# Patient Record
Sex: Female | Born: 1945 | Race: Black or African American | Hispanic: No | Smoking: Former smoker
Health system: Southern US, Community
[De-identification: ages and names within clinical notes are randomized; demographics above are authoritative.]

## PROBLEM LIST (undated history)

## (undated) DIAGNOSIS — I10 Essential (primary) hypertension: Secondary | ICD-10-CM

## (undated) DIAGNOSIS — M549 Dorsalgia, unspecified: Secondary | ICD-10-CM

## (undated) DIAGNOSIS — E7800 Pure hypercholesterolemia, unspecified: Secondary | ICD-10-CM

## (undated) HISTORY — PX: ABDOMINAL HYSTERECTOMY: SHX81

## (undated) HISTORY — DX: Essential (primary) hypertension: I10

## (undated) HISTORY — DX: Pure hypercholesterolemia, unspecified: E78.00

## (undated) HISTORY — DX: Dorsalgia, unspecified: M54.9

---

## 2016-02-14 DIAGNOSIS — I10 Essential (primary) hypertension: Secondary | ICD-10-CM | POA: Insufficient documentation

## 2020-09-01 DIAGNOSIS — Z298 Encounter for other specified prophylactic measures: Secondary | ICD-10-CM | POA: Diagnosis not present

## 2020-09-01 DIAGNOSIS — I10 Essential (primary) hypertension: Secondary | ICD-10-CM | POA: Diagnosis not present

## 2021-01-18 DIAGNOSIS — R202 Paresthesia of skin: Secondary | ICD-10-CM | POA: Diagnosis not present

## 2021-01-18 DIAGNOSIS — M5416 Radiculopathy, lumbar region: Secondary | ICD-10-CM | POA: Diagnosis not present

## 2021-03-05 DIAGNOSIS — I10 Essential (primary) hypertension: Secondary | ICD-10-CM | POA: Diagnosis not present

## 2021-03-05 DIAGNOSIS — R0609 Other forms of dyspnea: Secondary | ICD-10-CM | POA: Diagnosis not present

## 2021-03-05 DIAGNOSIS — K5900 Constipation, unspecified: Secondary | ICD-10-CM | POA: Diagnosis not present

## 2021-03-05 DIAGNOSIS — M5432 Sciatica, left side: Secondary | ICD-10-CM | POA: Diagnosis not present

## 2021-03-05 DIAGNOSIS — Z0000 Encounter for general adult medical examination without abnormal findings: Secondary | ICD-10-CM | POA: Diagnosis not present

## 2021-03-09 ENCOUNTER — Other Ambulatory Visit: Payer: Self-pay | Admitting: Family Medicine

## 2021-03-09 ENCOUNTER — Ambulatory Visit
Admission: RE | Admit: 2021-03-09 | Discharge: 2021-03-09 | Disposition: A | Discharge status: Home / Self Care | Payer: Medicare Other | Source: Ambulatory Visit | Attending: Family Medicine | Admitting: Family Medicine

## 2021-03-09 DIAGNOSIS — R0609 Other forms of dyspnea: Secondary | ICD-10-CM

## 2021-03-10 DIAGNOSIS — Z1211 Encounter for screening for malignant neoplasm of colon: Secondary | ICD-10-CM | POA: Diagnosis not present

## 2021-03-22 DIAGNOSIS — M5442 Lumbago with sciatica, left side: Secondary | ICD-10-CM | POA: Diagnosis not present

## 2021-03-23 ENCOUNTER — Other Ambulatory Visit: Payer: Self-pay

## 2021-03-23 ENCOUNTER — Encounter: Payer: Self-pay | Admitting: Cardiology

## 2021-03-23 ENCOUNTER — Ambulatory Visit
Admission: RE | Admit: 2021-03-23 | Discharge: 2021-03-23 | Disposition: A | Discharge status: Home / Self Care | Payer: Medicare Other | Source: Ambulatory Visit | Attending: Sports Medicine | Admitting: Sports Medicine

## 2021-03-23 ENCOUNTER — Ambulatory Visit: Payer: Medicare Other | Admitting: Cardiology

## 2021-03-23 ENCOUNTER — Other Ambulatory Visit: Payer: Self-pay | Admitting: Sports Medicine

## 2021-03-23 VITALS — BP 172/89 | HR 80 | Temp 98.0°F | Resp 16 | Ht 66.0 in | Wt 232.0 lb

## 2021-03-23 DIAGNOSIS — I10 Essential (primary) hypertension: Secondary | ICD-10-CM

## 2021-03-23 DIAGNOSIS — M545 Low back pain, unspecified: Secondary | ICD-10-CM | POA: Diagnosis not present

## 2021-03-23 DIAGNOSIS — M5489 Other dorsalgia: Secondary | ICD-10-CM

## 2021-03-23 DIAGNOSIS — R6889 Other general symptoms and signs: Secondary | ICD-10-CM

## 2021-03-23 DIAGNOSIS — R0609 Other forms of dyspnea: Secondary | ICD-10-CM | POA: Diagnosis not present

## 2021-03-23 DIAGNOSIS — Z6837 Body mass index (BMI) 37.0-37.9, adult: Secondary | ICD-10-CM

## 2021-03-23 NOTE — Progress Notes (Signed)
Date:  03/23/2021   ID:  Allison Gentry, Allison Gentry Dec 06, 1945, MRN [id number]  PCP:  Aliene Beams, MD  Cardiologist:  Tessa Lerner, DO, [facility] (established care 03/23/21)  REASON FOR CONSULT: Dyspnea on exertion  REQUESTING PHYSICIAN:  Aliene Beams, MD [address] [place],  [place] [address]  Chief Complaint  Patient presents with   Shortness of Breath   New Patient (Initial Visit)    HPI  Allison Gentry is a 75 y.o. African-American female who presents to the office with a chief complaint of  "shortness of breath." Patient's past medical history and cardiovascular risk factors include: Hypertension, obesity due to excess calories, postmenopausal female, advanced age.  She is referred to the office at the request of Aliene Beams, MD for evaluation of shortness of breath.  Patient is referred to the office for evaluation of shortness of breath.  The shortness of breath is mostly brought on by effort related activities.  Patient states when she goes up a flight of stairs or walks longer than her normal distance the symptoms do surface.  They usually resolve with resting.  She denies orthopnea, paroxysmal nocturnal dyspnea or lower extremity swelling.  The symptoms have been ongoing for the last several years and has not decided to have them addressed after discussing them with her PCP during her wellness visit.  Patient states that she used to walk about a mile every other day but now avoids such activities due to dyspnea on exertion.  Patient states that her home blood pressures are relatively well controlled.  SBP is around 130 mmHg.  FUNCTIONAL STATUS: Used to walk 1 mile every other day.  ALLERGIES: No Known Allergies  MEDICATION LIST PRIOR TO VISIT: Current Meds  Medication Sig   amLODipine (NORVASC) 5 MG tablet Take 5 mg by mouth daily.   Ascorbic Acid (VITAMIN C) 1000 MG tablet Take 1,000 mg by mouth daily.   BLACK COHOSH EXTRACT PO Take by mouth.   BLACK ELDERBERRY PO  Take 1 tablet by mouth daily at 6 (six) AM.   calcium carbonate (OS-CAL - DOSED IN MG OF ELEMENTAL CALCIUM) 1250 (500 Ca) MG tablet Take 1 tablet by mouth.   magnesium oxide (MAG-OX) 400 MG tablet Take 400 mg by mouth daily.   Multiple Vitamin (MULTIVITAMIN) tablet Take 1 tablet by mouth daily.   Omega-3 Fatty Acids (FISH OIL OMEGA-3 PO) Take 2,000 mg by mouth daily.   pyridOXINE (VITAMIN B-6) 100 MG tablet Take 100 mg by mouth daily.   Vitamin A 2400 MCG (8000 UT) CAPS Take 1 tablet by mouth daily at 6 (six) AM.   VITAMIN B1-B12 PO Take 2,500 mg by mouth daily at 6 (six) AM.   vitamin E 180 MG (400 UNITS) capsule Take 400 Units by mouth daily.   Zinc 50 MG CAPS Take 1 capsule by mouth daily at 6 (six) AM.     PAST MEDICAL HISTORY: Past Medical History:  Diagnosis Date   Hypertension     PAST SURGICAL HISTORY: Past Surgical History:  Procedure Laterality Date   ABDOMINAL HYSTERECTOMY      FAMILY HISTORY: The patient family history includes Heart disease in her father.  SOCIAL HISTORY:  The patient  reports that she has quit smoking. Her smoking use included cigarettes. She has a 1.00 pack-year smoking history. She has never used smokeless tobacco. She reports that she does not currently use alcohol. She reports that she does not currently use drugs.  REVIEW OF SYSTEMS:  Review of Systems  Constitutional: Negative for chills and fever.  HENT:  Negative for hoarse voice and nosebleeds.   Eyes:  Negative for discharge, double vision and pain.  Cardiovascular:  Positive for dyspnea on exertion. Negative for chest pain, claudication, leg swelling, near-syncope, orthopnea, palpitations, paroxysmal nocturnal dyspnea and syncope.  Respiratory:  Negative for hemoptysis and shortness of breath.   Musculoskeletal:  Negative for muscle cramps and myalgias.  Gastrointestinal:  Negative for abdominal pain, constipation, diarrhea, hematemesis, hematochezia, melena, nausea and vomiting.   Neurological:  Negative for dizziness and light-headedness.   PHYSICAL EXAM: Vitals with BMI 03/23/2021  Height 5\' 6"   Weight 232 lbs  BMI 37.46  Systolic 172  Diastolic 89  Pulse 80    CONSTITUTIONAL: Well-developed and well-nourished. No acute distress.  SKIN: Skin is warm and dry. No rash noted. No cyanosis. No pallor. No jaundice HEAD: Normocephalic and atraumatic.  EYES: No scleral icterus.  Arcus senilis MOUTH/THROAT: Moist oral membranes.  NECK: No JVD present. No thyromegaly noted. No carotid bruits  LYMPHATIC: No visible cervical adenopathy.  CHEST Normal respiratory effort. No intercostal retractions  LUNGS: Clear to auscultation bilaterally. Clear to auscultation bilaterally.  No stridor. No wheezes. No rales.  CARDIOVASCULAR: Regular rate and rhythm, positive S1-S2, no murmurs rubs or gallops appreciated. ABDOMINAL: Obese, soft, nontender, nondistended, positive bowel sounds all 4 quadrants. No apparent ascites.  EXTREMITIES: No peripheral edema, warm to touch HEMATOLOGIC: No significant bruising NEUROLOGIC: Oriented to person, place, and time. Nonfocal. Normal muscle tone.  PSYCHIATRIC: Normal mood and affect. Normal behavior. Cooperative  CARDIAC DATABASE: EKG: 03/23/2021: Normal sinus rhythm, 76 bpm, normal axis, without underlying ischemia or injury pattern.  Echocardiogram: No results found for this or any previous visit from the past 1095 days.    Stress Testing: No results found for this or any previous visit from the past 1095 days.   Heart Catheterization: None  LABORATORY DATA: No flowsheet data found.  No flowsheet data found.  Lipid Panel  No results found for: CHOL, TRIG, HDL, CHOLHDL, VLDL, LDLCALC, LDLDIRECT, LABVLDL  No components found for: NTPROBNP No results for input(s): PROBNP in the last 8760 hours. No results for input(s): TSH in the last 8760 hours.  BMP No results for input(s): NA, K, CL, CO2, GLUCOSE, BUN, CREATININE,  CALCIUM, GFRNONAA, GFRAA in the last 8760 hours.  HEMOGLOBIN A1C No results found for: HGBA1C, MPG  External Labs: Collected: 03/03/2020. Total cholesterol 203, triglycerides 149, HDL 62, LDL 117, non-HDL 142. Sodium 139, potassium 4.3, BUN 12, chloride 102, bicarb 29, creatinine 0.99  IMPRESSION:    ICD-10-CM   1. Dyspnea on exertion  R06.09 EKG 12-Lead    PCV ECHOCARDIOGRAM COMPLETE    PCV CARDIAC STRESS TEST    CT CARDIAC SCORING ([facility] LOCATIONS ONLY)    2. Worsening functional endurance  R68.89 CT CARDIAC SCORING ([facility] LOCATIONS ONLY)    3. Benign hypertension  I10     4. Class 2 severe obesity due to excess calories with serious comorbidity and body mass index (BMI) of 37.0 to 37.9 in adult [facility])  E66.01    Z68.37        RECOMMENDATIONS: NABILA ALBARRACIN is a 75 y.o. African-American female whose past medical history and cardiac risk factors include: Hypertension, obesity due to excess calories, postmenopausal female, advanced age.  Dyspnea on exertion Euvolemic on physical examination. No angina pectoris. Symptoms present for several years but now progressive. EKG: Nonischemic Home blood pressures are better controlled. Educated on  the importance of a low-salt diet. Echocardiogram will be ordered to evaluate for structural heart disease and left ventricular systolic function. Coronary calcium score for risk stratification -given her cholesterol levels and arcus senilis on physical examination. Exercise treadmill stress test to evaluate for functional status and exercise-induced ischemia  Worsening physical endurance: Patient has limited her physical activity due to symptoms of dyspnea on exertion. See above  Benign hypertension Office blood pressures are not well controlled. Home blood pressures per patient are well controlled, SBP ranges 130 mmHg. Currently managed by primary team. We emphasized the importance of low-salt diet  Class 2 severe obesity due to excess  calories with serious comorbidity and body mass index (BMI) of 37.0 to 37.9 in adult [facility]) Body mass index is 37.45 kg/m. I reviewed with the patient the importance of diet, regular physical activity/exercise, weight loss.   Patient is educated on increasing physical activity gradually as tolerated.  With the goal of moderate intensity exercise for 30 minutes a day 5 days a week.  FINAL MEDICATION LIST END OF ENCOUNTER: No orders of the defined types were placed in this encounter.   There are no discontinued medications.   Current Outpatient Medications:    amLODipine (NORVASC) 5 MG tablet, Take 5 mg by mouth daily., Disp: , Rfl:    Ascorbic Acid (VITAMIN C) 1000 MG tablet, Take 1,000 mg by mouth daily., Disp: , Rfl:    BLACK COHOSH EXTRACT PO, Take by mouth., Disp: , Rfl:    BLACK ELDERBERRY PO, Take 1 tablet by mouth daily at 6 (six) AM., Disp: , Rfl:    calcium carbonate (OS-CAL - DOSED IN MG OF ELEMENTAL CALCIUM) 1250 (500 Ca) MG tablet, Take 1 tablet by mouth., Disp: , Rfl:    magnesium oxide (MAG-OX) 400 MG tablet, Take 400 mg by mouth daily., Disp: , Rfl:    Multiple Vitamin (MULTIVITAMIN) tablet, Take 1 tablet by mouth daily., Disp: , Rfl:    Omega-3 Fatty Acids (FISH OIL OMEGA-3 PO), Take 2,000 mg by mouth daily., Disp: , Rfl:    pyridOXINE (VITAMIN B-6) 100 MG tablet, Take 100 mg by mouth daily., Disp: , Rfl:    Vitamin A 2400 MCG (8000 UT) CAPS, Take 1 tablet by mouth daily at 6 (six) AM., Disp: , Rfl:    VITAMIN B1-B12 PO, Take 2,500 mg by mouth daily at 6 (six) AM., Disp: , Rfl:    vitamin E 180 MG (400 UNITS) capsule, Take 400 Units by mouth daily., Disp: , Rfl:    Zinc 50 MG CAPS, Take 1 capsule by mouth daily at 6 (six) AM., Disp: , Rfl:   Orders Placed This Encounter  Procedures   CT CARDIAC SCORING ([facility] LOCATIONS ONLY)   PCV CARDIAC STRESS TEST   EKG 12-Lead   PCV ECHOCARDIOGRAM COMPLETE    There are no Patient Instructions on file for this visit.   --Continue  cardiac medications as reconciled in final medication list. --Return in about 4 weeks (around 04/20/2021) for Follow up, Dyspnea, Review test results. Or sooner if needed. --Continue follow-up with your primary care physician regarding the management of your other chronic comorbid conditions.  Patient's questions and concerns were addressed to her satisfaction. She voices understanding of the instructions provided during this encounter.   This note was created using a voice recognition software as a result there may be grammatical errors inadvertently enclosed that do not reflect the nature of this encounter. Every attempt is made to correct such errors.  Rex Kras, [place], [facility]  Pager: [phone] Office: [phone]

## 2021-03-29 ENCOUNTER — Ambulatory Visit: Payer: Medicare Other

## 2021-03-29 ENCOUNTER — Other Ambulatory Visit: Payer: Self-pay

## 2021-03-29 DIAGNOSIS — R0609 Other forms of dyspnea: Secondary | ICD-10-CM

## 2021-03-30 DIAGNOSIS — M545 Low back pain, unspecified: Secondary | ICD-10-CM | POA: Diagnosis not present

## 2021-03-30 DIAGNOSIS — M79605 Pain in left leg: Secondary | ICD-10-CM | POA: Diagnosis not present

## 2021-03-30 DIAGNOSIS — M79604 Pain in right leg: Secondary | ICD-10-CM | POA: Diagnosis not present

## 2021-03-31 DIAGNOSIS — M5442 Lumbago with sciatica, left side: Secondary | ICD-10-CM | POA: Diagnosis not present

## 2021-03-31 DIAGNOSIS — I10 Essential (primary) hypertension: Secondary | ICD-10-CM | POA: Diagnosis not present

## 2021-04-02 ENCOUNTER — Other Ambulatory Visit: Payer: Self-pay

## 2021-04-02 ENCOUNTER — Ambulatory Visit: Payer: Medicare Other

## 2021-04-02 DIAGNOSIS — R0609 Other forms of dyspnea: Secondary | ICD-10-CM

## 2021-04-05 NOTE — Progress Notes (Signed)
Called pt to give her the results to her echo. Pt understood.

## 2021-04-05 NOTE — Progress Notes (Signed)
Called pt and informed her about her stress test pt understood

## 2021-04-06 DIAGNOSIS — M545 Low back pain, unspecified: Secondary | ICD-10-CM | POA: Diagnosis not present

## 2021-04-06 DIAGNOSIS — M79605 Pain in left leg: Secondary | ICD-10-CM | POA: Diagnosis not present

## 2021-04-06 DIAGNOSIS — M79604 Pain in right leg: Secondary | ICD-10-CM | POA: Diagnosis not present

## 2021-04-08 DIAGNOSIS — M79604 Pain in right leg: Secondary | ICD-10-CM | POA: Diagnosis not present

## 2021-04-08 DIAGNOSIS — M79605 Pain in left leg: Secondary | ICD-10-CM | POA: Diagnosis not present

## 2021-04-08 DIAGNOSIS — M545 Low back pain, unspecified: Secondary | ICD-10-CM | POA: Diagnosis not present

## 2021-04-12 ENCOUNTER — Ambulatory Visit
Admission: RE | Admit: 2021-04-12 | Discharge: 2021-04-12 | Disposition: A | Discharge status: Home / Self Care | Payer: No Typology Code available for payment source | Source: Ambulatory Visit | Attending: Cardiology | Admitting: Cardiology

## 2021-04-12 DIAGNOSIS — R0609 Other forms of dyspnea: Secondary | ICD-10-CM

## 2021-04-12 DIAGNOSIS — R6889 Other general symptoms and signs: Secondary | ICD-10-CM

## 2021-04-14 DIAGNOSIS — M79605 Pain in left leg: Secondary | ICD-10-CM | POA: Diagnosis not present

## 2021-04-14 DIAGNOSIS — M79604 Pain in right leg: Secondary | ICD-10-CM | POA: Diagnosis not present

## 2021-04-14 DIAGNOSIS — M545 Low back pain, unspecified: Secondary | ICD-10-CM | POA: Diagnosis not present

## 2021-04-21 DIAGNOSIS — M5442 Lumbago with sciatica, left side: Secondary | ICD-10-CM | POA: Diagnosis not present

## 2021-04-27 ENCOUNTER — Ambulatory Visit: Payer: Medicaid Other | Admitting: Cardiology

## 2021-04-27 NOTE — Progress Notes (Signed)
Called pt no answer and could not leave a vm 

## 2021-04-28 NOTE — Progress Notes (Signed)
Called and spoke to pt.  Pt aware. 

## 2021-05-03 ENCOUNTER — Other Ambulatory Visit: Payer: Self-pay

## 2021-05-03 ENCOUNTER — Ambulatory Visit: Payer: Medicaid Other | Admitting: Cardiology

## 2021-05-03 ENCOUNTER — Encounter: Payer: Self-pay | Admitting: Cardiology

## 2021-05-03 VITALS — BP 161/82 | HR 77 | Resp 16 | Ht 66.0 in | Wt 231.0 lb

## 2021-05-03 DIAGNOSIS — I10 Essential (primary) hypertension: Secondary | ICD-10-CM

## 2021-05-03 DIAGNOSIS — R931 Abnormal findings on diagnostic imaging of heart and coronary circulation: Secondary | ICD-10-CM

## 2021-05-03 DIAGNOSIS — R0609 Other forms of dyspnea: Secondary | ICD-10-CM | POA: Diagnosis not present

## 2021-05-03 MED ORDER — AMLODIPINE BESYLATE 10 MG PO TABS: 10.0000 mg | ORAL_TABLET | Freq: Every day | ORAL | 0 refills | Status: DC

## 2021-05-03 NOTE — Progress Notes (Signed)
Date:  05/03/2021   ID:  Allison Gentry, Allison Gentry Feb 03, 1946, MRN [id number]  PCP:  Aliene Beams, MD  Cardiologist:  Tessa Lerner, DO, [facility] (established care 03/23/21)  Date: 05/03/21 Last Office Visit: 03/23/2021  Chief Complaint  Patient presents with   Dyspnea on exertion   Results   Follow-up    HPI  Allison Gentry is a 75 y.o. African-American female who presents to the office with a chief complaint of  " reevaluation of shortness of breath and discuss test results." Patient's past medical history and cardiovascular risk factors include: Mild coronary artery calcification, hypertension, obesity due to excess calories, postmenopausal female, advanced age.  She is referred to the office at the request of Aliene Beams, MD for evaluation of shortness of breath.  During initial office visit patient was having complaints of shortness of breath with effort related activities.  Her symptoms would be brought on by going up a flight of stairs or walking longer than her normal distance and his symptoms would resolve after resting.  At the last office visit the shared decision was to proceed with an ischemic evaluation.  Echocardiogram notes preserved LVEF, no significant valvular heart disease.  Mild coronary artery calcification with a total CAC of 20.8 placing her at the 51st percentile for age.  And exercise treadmill stress test equivocal as the patient did not achieve 85% of age-predicted maximum heart rate and also had hypertensive response to exercise.  Since last office visit patient states that she is doing well from a cardiovascular standpoint.  Her shortness of breath with effort related activities have improved but still present.  She continues to remain euvolemic and not in congestive heart failure.  She denies any angina pectoris.  FUNCTIONAL STATUS: Used to walk 1 mile every other day.  ALLERGIES: No Known Allergies  MEDICATION LIST PRIOR TO VISIT: Current Meds  Medication  Sig   Ascorbic Acid (VITAMIN C) 1000 MG tablet Take 1,000 mg by mouth daily.   BLACK COHOSH EXTRACT PO Take by mouth.   BLACK ELDERBERRY PO Take 1 tablet by mouth daily at 6 (six) AM.   calcium carbonate (OS-CAL - DOSED IN MG OF ELEMENTAL CALCIUM) 1250 (500 Ca) MG tablet Take 1 tablet by mouth.   magnesium oxide (MAG-OX) 400 MG tablet Take 400 mg by mouth daily.   Multiple Vitamin (MULTIVITAMIN) tablet Take 1 tablet by mouth daily.   Omega-3 Fatty Acids (FISH OIL OMEGA-3 PO) Take 2,000 mg by mouth daily.   pyridOXINE (VITAMIN B-6) 100 MG tablet Take 100 mg by mouth daily.   Vitamin A 2400 MCG (8000 UT) CAPS Take 1 tablet by mouth daily at 6 (six) AM.   VITAMIN B1-B12 PO Take 2,500 mg by mouth daily at 6 (six) AM.   vitamin E 180 MG (400 UNITS) capsule Take 400 Units by mouth daily.   Zinc 50 MG CAPS Take 1 capsule by mouth daily at 6 (six) AM.   [DISCONTINUED] amLODipine (NORVASC) 5 MG tablet Take 5 mg by mouth daily.     PAST MEDICAL HISTORY: Past Medical History:  Diagnosis Date   Hypertension     PAST SURGICAL HISTORY: Past Surgical History:  Procedure Laterality Date   ABDOMINAL HYSTERECTOMY      FAMILY HISTORY: The patient family history includes Heart disease in her father.  SOCIAL HISTORY:  The patient  reports that she has quit smoking. Her smoking use included cigarettes. She has a 1.00 pack-year smoking history. She has never  used smokeless tobacco. She reports that she does not currently use alcohol. She reports that she does not currently use drugs.  REVIEW OF SYSTEMS: Review of Systems  Constitutional: Negative for chills and fever.  HENT:  Negative for hoarse voice and nosebleeds.   Eyes:  Negative for discharge, double vision and pain.  Cardiovascular:  Positive for dyspnea on exertion. Negative for chest pain, claudication, leg swelling, near-syncope, orthopnea, palpitations, paroxysmal nocturnal dyspnea and syncope.  Respiratory:  Negative for hemoptysis and  shortness of breath.   Musculoskeletal:  Negative for muscle cramps and myalgias.  Gastrointestinal:  Negative for abdominal pain, constipation, diarrhea, hematemesis, hematochezia, melena, nausea and vomiting.  Neurological:  Negative for dizziness and light-headedness.   PHYSICAL EXAM: Vitals with BMI 05/03/2021 05/03/2021 03/23/2021  Height - 5\' 6"  5\' 6"   Weight - 231 lbs 232 lbs  BMI - 37.3 37.46  Systolic 161 166  Diastolic 82 85 89  Pulse 77 77 80    CONSTITUTIONAL: Well-developed and well-nourished. No acute distress.  SKIN: Skin is warm and dry. No rash noted. No cyanosis. No pallor. No jaundice HEAD: Normocephalic and atraumatic.  EYES: No scleral icterus.  Arcus senilis MOUTH/THROAT: Moist oral membranes.  NECK: No JVD present. No thyromegaly noted. No carotid bruits  LYMPHATIC: No visible cervical adenopathy.  CHEST Normal respiratory effort. No intercostal retractions  LUNGS: Clear to auscultation bilaterally. Clear to auscultation bilaterally.  No stridor. No wheezes. No rales.  CARDIOVASCULAR: Regular rate and rhythm, positive S1-S2, no murmurs rubs or gallops appreciated. ABDOMINAL: Obese, soft, nontender, nondistended, positive bowel sounds all 4 quadrants. No apparent ascites.  EXTREMITIES: No peripheral edema, warm to touch HEMATOLOGIC: No significant bruising NEUROLOGIC: Oriented to person, place, and time. Nonfocal. Normal muscle tone.  PSYCHIATRIC: Normal mood and affect. Normal behavior. Cooperative  CARDIAC DATABASE: EKG: 03/23/2021: Normal sinus rhythm, 76 bpm, normal axis, without underlying ischemia or injury pattern.  Echocardiogram: 04/02/2021: Normal LV systolic function with visual EF 60-65%. Left ventricle cavity is normal in size. Mild left ventricular hypertrophy. Normal global wall motion. Normal diastolic filling pattern, normal LAP. No significant valvular abnormalities. No prior study for comparison.   Stress Testing: Exercise treadmill  stress test 03/29/2021: Exercise treadmill stress test performed using Bruce protocol. Patient reached 4.6 METS, and 84% of age predicted maximum heart rate. Exercise capacity was low. No chest pain reported. Exertional dyspnea reported. Normal heart rate response. Resting and peak stress hypertension with peak BP reaching 232/90 mmHg. Stress EKG at 84% MPHR revealed no ischemic changes. Recommend clinical correlation due to suboptimal exercise.  CT Cardiac Scoring: 04/12/2021  Left Main: 0 LAD: 19 LCx: 0 RCA: 1.8 Total Agatston Score: 20.8 MESA database percentile: 51 AORTA MEASUREMENTS: Ascending Aorta: 31 mm Descending Aorta: 22 mm Coronary calcium score of 20.8 is at the 51st percentile for the patient's age, sex and race.  Heart Catheterization: None  LABORATORY DATA: No flowsheet data found.  No flowsheet data found.  Lipid Panel  No results found for: CHOL, TRIG, HDL, CHOLHDL, VLDL, LDLCALC, LDLDIRECT, LABVLDL  No components found for: NTPROBNP No results for input(s): PROBNP in the last 8760 hours. No results for input(s): TSH in the last 8760 hours.  BMP No results for input(s): NA, K, CL, CO2, GLUCOSE, BUN, CREATININE, CALCIUM, GFRNONAA, GFRAA in the last 8760 hours.  HEMOGLOBIN A1C No results found for: HGBA1C, MPG  External Labs: Collected: 03/03/2020. Total cholesterol 203, triglycerides 149, HDL 62, LDL 117, non-HDL 142. Sodium 139, potassium 4.3, BUN  12, chloride 102, bicarb 29, creatinine 0.99  IMPRESSION:    ICD-10-CM   1. Agatston coronary artery calcium score less than 100  R93.1     2. Dyspnea on exertion  R06.09     3. Benign hypertension  I10 amLODipine (NORVASC) 10 MG tablet    4. Class 2 severe obesity due to excess calories with serious comorbidity and body mass index (BMI) of 37.0 to 37.9 in adult [facility])  E66.01    Z68.37         RECOMMENDATIONS: Allison Gentry is a 75 y.o. African-American female whose past medical history and  cardiac risk factors include: Mild coronary artery calcification, hypertension, obesity due to excess calories, postmenopausal female, advanced age.  Agatston coronary artery calcium score less than 100 Total CAC 20.8 AU, 51st percentile. Recommended aspirin 81 mg p.o. daily and Crestor 5 mg p.o. nightly.  Patient seems to be reluctant and would like to discuss it further with PCP. Echocardiogram results reviewed. GXT noted to be equivocal as the patient did not achieve 85% of age-predicted maximum heart rate.  We discussed undergoing nuclear stress test but she would like to hold off on this at the current time.  Dyspnea on exertion Euvolemic on physical examination. No angina pectoris. Symptoms present for several years but now progressive. EKG: Nonischemic Echo: LVEF preserved, normal diastolic function, no significant valvular heart disease GXT: Equivocal did not achieve 85% of age-predicted maximum heart rate and hypertensive response to exercise with peak BP 232/90 mmHg.  Recommended up titration of antihypertensive medication especially since her blood pressures with exercise are greater than 180 mmHg.  However, patient would like to discuss it further with PCP.  She is made aware that blood pressures greater than 180 mmHg places her at high risk of CVA/TIA.  However, she still wanted to discuss blood pressure management with PCP.  Patient is made aware that if she has any focal neurological deficits she needs to go to the hospital for further evaluation and management.  However, by the end of our office visit patient realized the importance and is willing to increase amlodipine to 10 mg p.o. daily Her next appointment with PCP is currently scheduled for February 2022 I have humbly asked her to move up this appointment to discuss antihypertensive medications and consider aspirin/statin therapy for reasons mentioned above.  Benign hypertension Office blood pressures are not well  controlled. Please see above Currently managed by primary team. We emphasized the importance of low-salt diet  Class 2 severe obesity due to excess calories with serious comorbidity and body mass index (BMI) of 37.0 to 37.9 in adult [facility]) Body mass index is 37.28 kg/m. I reviewed with the patient the importance of diet, regular physical activity/exercise, weight loss.   Patient is educated on increasing physical activity gradually as tolerated.  With the goal of moderate intensity exercise for 30 minutes a day 5 days a week.  FINAL MEDICATION LIST END OF ENCOUNTER: Meds ordered this encounter  Medications   amLODipine (NORVASC) 10 MG tablet    Sig: Take 1 tablet (10 mg total) by mouth daily.    Dispense:  90 tablet    Refill:  0     Medications Discontinued During This Encounter  Medication Reason   amLODipine (NORVASC) 5 MG tablet Reorder     Current Outpatient Medications:    Ascorbic Acid (VITAMIN C) 1000 MG tablet, Take 1,000 mg by mouth daily., Disp: , Rfl:    BLACK COHOSH EXTRACT  PO, Take by mouth., Disp: , Rfl:    BLACK ELDERBERRY PO, Take 1 tablet by mouth daily at 6 (six) AM., Disp: , Rfl:    calcium carbonate (OS-CAL - DOSED IN MG OF ELEMENTAL CALCIUM) 1250 (500 Ca) MG tablet, Take 1 tablet by mouth., Disp: , Rfl:    magnesium oxide (MAG-OX) 400 MG tablet, Take 400 mg by mouth daily., Disp: , Rfl:    Multiple Vitamin (MULTIVITAMIN) tablet, Take 1 tablet by mouth daily., Disp: , Rfl:    Omega-3 Fatty Acids (FISH OIL OMEGA-3 PO), Take 2,000 mg by mouth daily., Disp: , Rfl:    pyridOXINE (VITAMIN B-6) 100 MG tablet, Take 100 mg by mouth daily., Disp: , Rfl:    Vitamin A 2400 MCG (8000 UT) CAPS, Take 1 tablet by mouth daily at 6 (six) AM., Disp: , Rfl:    VITAMIN B1-B12 PO, Take 2,500 mg by mouth daily at 6 (six) AM., Disp: , Rfl:    vitamin E 180 MG (400 UNITS) capsule, Take 400 Units by mouth daily., Disp: , Rfl:    Zinc 50 MG CAPS, Take 1 capsule by mouth daily at 6  (six) AM., Disp: , Rfl:    amLODipine (NORVASC) 10 MG tablet, Take 1 tablet (10 mg total) by mouth daily., Disp: 90 tablet, Rfl: 0  No orders of the defined types were placed in this encounter.   There are no Patient Instructions on file for this visit.   --Continue cardiac medications as reconciled in final medication list. --Return in about 3 months (around 08/01/2021) for Follow up, Dyspnea, Coronary artery calcification. Or sooner if needed. --Continue follow-up with your primary care physician regarding the management of your other chronic comorbid conditions.  Patient's questions and concerns were addressed to her satisfaction. She voices understanding of the instructions provided during this encounter.   This note was created using a voice recognition software as a result there may be grammatical errors inadvertently enclosed that do not reflect the nature of this encounter. Every attempt is made to correct such errors.  Tessa Lerner, [place], [facility]  Pager: [phone] Office: [phone]

## 2021-07-01 DIAGNOSIS — I10 Essential (primary) hypertension: Secondary | ICD-10-CM | POA: Diagnosis not present

## 2021-08-02 ENCOUNTER — Ambulatory Visit: Payer: Medicaid Other | Admitting: Cardiology

## 2021-08-02 ENCOUNTER — Encounter: Payer: Self-pay | Admitting: Cardiology

## 2021-08-02 ENCOUNTER — Other Ambulatory Visit: Payer: Self-pay

## 2021-08-02 VITALS — BP 155/84 | HR 97 | Temp 97.6°F | Resp 14 | Ht 66.0 in | Wt 235.0 lb

## 2021-08-02 DIAGNOSIS — I10 Essential (primary) hypertension: Secondary | ICD-10-CM | POA: Diagnosis not present

## 2021-08-02 DIAGNOSIS — R931 Abnormal findings on diagnostic imaging of heart and coronary circulation: Secondary | ICD-10-CM

## 2021-08-02 DIAGNOSIS — R0609 Other forms of dyspnea: Secondary | ICD-10-CM

## 2021-08-02 MED ORDER — ASPIRIN EC 81 MG PO TBEC
81.0000 mg | DELAYED_RELEASE_TABLET | Freq: Every day | ORAL | 11 refills | Status: AC
Start: 2021-08-02 — End: ?

## 2021-08-02 MED ORDER — ROSUVASTATIN CALCIUM 5 MG PO TABS: 5.0000 mg | ORAL_TABLET | Freq: Every day | ORAL | 0 refills | Status: DC

## 2021-08-02 NOTE — Progress Notes (Signed)
Date:  08/02/2021   ID:  Allison, Gentry 04-28-1946, MRN [id number]  PCP:  Caren Macadam, MD  Cardiologist:  Rex Kras, DO, [facility] (established care 03/23/21)  Date: 08/02/21 Last Office Visit: 05/03/2021  Chief Complaint  Patient presents with   Shortness of Breath   Coronary artery calcification    Follow-up    3 months    HPI  Allison Gentry is a 76 y.o. African-American female who presents to the office with a chief complaint of  " shortness of breath and coronary calcification." Patient's past medical history and cardiovascular risk factors include: Mild coronary artery calcification, hypertension, obesity due to excess calories, postmenopausal female, advanced age.  During initial consultation patient was concerned of symptoms of shortness of breath with effort related activities predominantly when going up a flight of stairs or with overexertion while walking.  She underwent an echocardiogram which noted preserved LVEF without any significant valvular heart disease.  She was noted to have mild coronary calcification and exercise treadmill stress test was equivocal as she did not achieve 85% of age-predicted maximum heart rate and hypertensive response to exercise.  Recommended that we focus on up titration of antihypertensive medications and once her blood pressure is better controlled consider different modality to evaluate for reversible ischemia or CAD.  She presents for 67-monthfollow-up visit.  She denies angina pectoris and states her dyspnea on exertion is improving.  Patient states that she been checking her blood pressures at home and based on her memory its usually less than 130 mmHg.  At the last visit was recommended to start aspirin 81 mg p.o. daily and Crestor 5 mg p.o. nightly.  She has held off on both these recommendations.  She has not had a chance to discuss up titration of antihypertensive medications with her PCP according to the patient.  FUNCTIONAL  STATUS: Used to walk 1 mile every other day.  ALLERGIES: No Known Allergies  MEDICATION LIST PRIOR TO VISIT: Current Meds  Medication Sig   amLODipine (NORVASC) 5 MG tablet Take 5 mg by mouth daily.   Ascorbic Acid (VITAMIN C) 1000 MG tablet Take 1,000 mg by mouth daily.   aspirin EC 81 MG tablet Take 1 tablet (81 mg total) by mouth daily. Swallow whole.   BLACK COHOSH EXTRACT PO Take by mouth.   BLACK ELDERBERRY PO Take 1 tablet by mouth daily at 6 (six) AM.   calcium carbonate (OS-CAL - DOSED IN MG OF ELEMENTAL CALCIUM) 1250 (500 Ca) MG tablet Take 1 tablet by mouth.   magnesium oxide (MAG-OX) 400 MG tablet Take 400 mg by mouth daily.   Multiple Vitamin (MULTIVITAMIN) tablet Take 1 tablet by mouth daily.   Omega-3 Fatty Acids (FISH OIL OMEGA-3 PO) Take 2,000 mg by mouth daily.   pyridOXINE (VITAMIN B-6) 100 MG tablet Take 100 mg by mouth daily.   rosuvastatin (CRESTOR) 5 MG tablet Take 1 tablet (5 mg total) by mouth at bedtime.   Vitamin A 2400 MCG (8000 UT) CAPS Take 1 tablet by mouth daily at 6 (six) AM.   VITAMIN B1-B12 PO Take 2,500 mg by mouth daily at 6 (six) AM.   vitamin E 180 MG (400 UNITS) capsule Take 400 Units by mouth daily.   Zinc 50 MG CAPS Take 1 capsule by mouth daily at 6 (six) AM.   [DISCONTINUED] amLODipine (NORVASC) 10 MG tablet Take 1 tablet (10 mg total) by mouth daily.     PAST MEDICAL HISTORY: Past  Medical History:  Diagnosis Date   Hypertension     PAST SURGICAL HISTORY: Past Surgical History:  Procedure Laterality Date   ABDOMINAL HYSTERECTOMY      FAMILY HISTORY: The patient family history includes Heart disease in her father.  SOCIAL HISTORY:  The patient  reports that she quit smoking about 63 years ago. Her smoking use included cigarettes. She has a 1.00 pack-year smoking history. She has never used smokeless tobacco. She reports that she does not currently use alcohol. She reports that she does not currently use drugs.  REVIEW OF  SYSTEMS: Review of Systems  Constitutional: Negative for chills and fever.  HENT:  Negative for hoarse voice and nosebleeds.   Eyes:  Negative for discharge, double vision and pain.  Cardiovascular:  Positive for dyspnea on exertion (improving). Negative for chest pain, claudication, leg swelling, near-syncope, orthopnea, palpitations, paroxysmal nocturnal dyspnea and syncope.  Respiratory:  Negative for hemoptysis and shortness of breath.   Musculoskeletal:  Negative for muscle cramps and myalgias.  Gastrointestinal:  Negative for abdominal pain, constipation, diarrhea, hematemesis, hematochezia, melena, nausea and vomiting.  Neurological:  Negative for dizziness and light-headedness.   PHYSICAL EXAM: Vitals with BMI 08/02/2021 05/03/2021 05/03/2021  Height '5\' 6"'  - '5\' 6"'   Weight 235 lbs - 231 lbs  BMI 91.79 - 15.0  Systolic 569 794 801  Diastolic 84 82 85  Pulse 97 77 77    CONSTITUTIONAL: Well-developed and well-nourished. No acute distress.  SKIN: Skin is warm and dry. No rash noted. No cyanosis. No pallor. No jaundice HEAD: Normocephalic and atraumatic.  EYES: No scleral icterus.  Arcus senilis MOUTH/THROAT: Moist oral membranes.  NECK: No JVD present. No thyromegaly noted. No carotid bruits  LYMPHATIC: No visible cervical adenopathy.  CHEST Normal respiratory effort. No intercostal retractions  LUNGS: Clear to auscultation bilaterally. Clear to auscultation bilaterally.  No stridor. No wheezes. No rales.  CARDIOVASCULAR: Regular rate and rhythm, positive S1-S2, no murmurs rubs or gallops appreciated. ABDOMINAL: Obese, soft, nontender, nondistended, positive bowel sounds all 4 quadrants. No apparent ascites.  EXTREMITIES: No peripheral edema, warm to touch HEMATOLOGIC: No significant bruising NEUROLOGIC: Oriented to person, place, and time. Nonfocal. Normal muscle tone.  PSYCHIATRIC: Normal mood and affect. Normal behavior. Cooperative  CARDIAC DATABASE: EKG: 03/23/2021:  Normal sinus rhythm, 76 bpm, normal axis, without underlying ischemia or injury pattern.  Echocardiogram: 04/02/2021: Normal LV systolic function with visual EF 60-65%. Left ventricle cavity is normal in size. Mild left ventricular hypertrophy. Normal global wall motion. Normal diastolic filling pattern, normal LAP. No significant valvular abnormalities. No prior study for comparison.   Stress Testing: Exercise treadmill stress test 03/29/2021: Exercise treadmill stress test performed using Bruce protocol. Patient reached 4.6 METS, and 84% of age predicted maximum heart rate. Exercise capacity was low. No chest pain reported. Exertional dyspnea reported. Normal heart rate response. Resting and peak stress hypertension with peak BP reaching 232/90 mmHg. Stress EKG at 84% MPHR revealed no ischemic changes. Recommend clinical correlation due to suboptimal exercise.  CT Cardiac Scoring: 04/12/2021  Left Main: 0 LAD: 19 LCx: 0 RCA: 1.8 Total Agatston Score: 20.8 MESA database percentile: 51 AORTA MEASUREMENTS: Ascending Aorta: 31 mm Descending Aorta: 22 mm Coronary calcium score of 20.8 is at the 51st percentile for the patient's age, sex and race.  Heart Catheterization: None  LABORATORY DATA: No flowsheet data found.  No flowsheet data found.  Lipid Panel  No results found for: CHOL, TRIG, HDL, CHOLHDL, VLDL, LDLCALC, LDLDIRECT, LABVLDL  No  components found for: NTPROBNP No results for input(s): PROBNP in the last 8760 hours. No results for input(s): TSH in the last 8760 hours.  BMP No results for input(s): NA, K, CL, CO2, GLUCOSE, BUN, CREATININE, CALCIUM, GFRNONAA, GFRAA in the last 8760 hours.  HEMOGLOBIN A1C No results found for: HGBA1C, MPG  External Labs: Collected: 03/03/2020. Total cholesterol 203, triglycerides 149, HDL 62, LDL 117, non-HDL 142. Sodium 139, potassium 4.3, BUN 12, chloride 102, bicarb 29, creatinine 0.99  IMPRESSION:    ICD-10-CM   1.  Agatston coronary artery calcium score less than 100  R93.1 aspirin EC 81 MG tablet    rosuvastatin (CRESTOR) 5 MG tablet    CMP14+EGFR    Lipid Panel With LDL/HDL Ratio    LDL cholesterol, direct    2. Dyspnea on exertion  R06.09     3. Benign hypertension  I10         RECOMMENDATIONS: FELICIANA NARAYAN is a 76 y.o. African-American female whose past medical history and cardiac risk factors include: Mild coronary artery calcification, hypertension, obesity due to excess calories, postmenopausal female, advanced age.  Agatston coronary artery calcium score less than 100 Total CAC 20.8 AU, 51st percentile Recommend aspirin 81 mg p.o. daily. Recommend Crestor 5 mg p.o. nightly. Fasting lipid profile in 6 weeks to reevaluate lipids and liver function. Echocardiogram results reviewed and noted above for further reference. GXT: Noted to be equivocal as the patient did not achieve 85% of age-predicted maximum heart rate and hypertensive response to exercise.  I still think she would benefit from better blood pressure control and once achieved we consider a different modality of stress testing to evaluate for reversible ischemia/screen for CAD.  Patient states that she will discuss this further with PCP.  Dyspnea on exertion Improving. Denies angina pectoris. Euvolemic on physical examination. EKG: Nonischemic. Echo: Preserved LVEF, normal diastolic function, and no significant valvular heart disease. Once blood pressure is better controlled recommend a different modality to screen for CAD as discussed above.  Benign hypertension Office blood pressures are not well controlled. Per patient home blood pressures are better controlled. She would like to defer management of benign essential hypertension to PCP at this time.   FINAL MEDICATION LIST END OF ENCOUNTER: Meds ordered this encounter  Medications   aspirin EC 81 MG tablet    Sig: Take 1 tablet (81 mg total) by mouth daily. Swallow  whole.    Dispense:  30 tablet    Refill:  11   rosuvastatin (CRESTOR) 5 MG tablet    Sig: Take 1 tablet (5 mg total) by mouth at bedtime.    Dispense:  90 tablet    Refill:  0     Medications Discontinued During This Encounter  Medication Reason   amLODipine (NORVASC) 10 MG tablet Dose change     Current Outpatient Medications:    amLODipine (NORVASC) 5 MG tablet, Take 5 mg by mouth daily., Disp: , Rfl:    Ascorbic Acid (VITAMIN C) 1000 MG tablet, Take 1,000 mg by mouth daily., Disp: , Rfl:    aspirin EC 81 MG tablet, Take 1 tablet (81 mg total) by mouth daily. Swallow whole., Disp: 30 tablet, Rfl: 11   BLACK COHOSH EXTRACT PO, Take by mouth., Disp: , Rfl:    BLACK ELDERBERRY PO, Take 1 tablet by mouth daily at 6 (six) AM., Disp: , Rfl:    calcium carbonate (OS-CAL - DOSED IN MG OF ELEMENTAL CALCIUM) 1250 (500 Ca) MG tablet,  Take 1 tablet by mouth., Disp: , Rfl:    magnesium oxide (MAG-OX) 400 MG tablet, Take 400 mg by mouth daily., Disp: , Rfl:    Multiple Vitamin (MULTIVITAMIN) tablet, Take 1 tablet by mouth daily., Disp: , Rfl:    Omega-3 Fatty Acids (FISH OIL OMEGA-3 PO), Take 2,000 mg by mouth daily., Disp: , Rfl:    pyridOXINE (VITAMIN B-6) 100 MG tablet, Take 100 mg by mouth daily., Disp: , Rfl:    rosuvastatin (CRESTOR) 5 MG tablet, Take 1 tablet (5 mg total) by mouth at bedtime., Disp: 90 tablet, Rfl: 0   Vitamin A 2400 MCG (8000 UT) CAPS, Take 1 tablet by mouth daily at 6 (six) AM., Disp: , Rfl:    VITAMIN B1-B12 PO, Take 2,500 mg by mouth daily at 6 (six) AM., Disp: , Rfl:    vitamin E 180 MG (400 UNITS) capsule, Take 400 Units by mouth daily., Disp: , Rfl:    Zinc 50 MG CAPS, Take 1 capsule by mouth daily at 6 (six) AM., Disp: , Rfl:   Orders Placed This Encounter  Procedures   CMP14+EGFR   Lipid Panel With LDL/HDL Ratio   LDL cholesterol, direct    There are no Patient Instructions on file for this visit.   --Continue cardiac medications as reconciled in final  medication list. --Return in about 6 months (around 02/02/2022) for Follow up, Coronary artery calcification, Lipid. Or sooner if needed. --Continue follow-up with your primary care physician regarding the management of your other chronic comorbid conditions.  Patient's questions and concerns were addressed to her satisfaction. She voices understanding of the instructions provided during this encounter.   This note was created using a voice recognition software as a result there may be grammatical errors inadvertently enclosed that do not reflect the nature of this encounter. Every attempt is made to correct such errors.  Rex Kras, [place], [facility]  Pager: [phone] Office: [phone]

## 2021-08-23 ENCOUNTER — Other Ambulatory Visit: Payer: Self-pay

## 2021-08-23 DIAGNOSIS — R931 Abnormal findings on diagnostic imaging of heart and coronary circulation: Secondary | ICD-10-CM

## 2021-11-02 ENCOUNTER — Other Ambulatory Visit: Payer: Self-pay | Admitting: Cardiology

## 2021-11-02 DIAGNOSIS — R931 Abnormal findings on diagnostic imaging of heart and coronary circulation: Secondary | ICD-10-CM

## 2022-02-03 ENCOUNTER — Ambulatory Visit: Payer: Medicare Other | Admitting: Cardiology

## 2022-03-11 DIAGNOSIS — E782 Mixed hyperlipidemia: Secondary | ICD-10-CM | POA: Diagnosis not present

## 2022-03-11 DIAGNOSIS — M7918 Myalgia, other site: Secondary | ICD-10-CM | POA: Diagnosis not present

## 2022-03-11 DIAGNOSIS — Z0000 Encounter for general adult medical examination without abnormal findings: Secondary | ICD-10-CM | POA: Diagnosis not present

## 2022-03-11 DIAGNOSIS — I10 Essential (primary) hypertension: Secondary | ICD-10-CM | POA: Diagnosis not present

## 2022-09-13 DIAGNOSIS — N1831 Chronic kidney disease, stage 3a: Secondary | ICD-10-CM | POA: Diagnosis not present

## 2022-09-13 DIAGNOSIS — R5383 Other fatigue: Secondary | ICD-10-CM | POA: Diagnosis not present

## 2022-09-13 DIAGNOSIS — R7301 Impaired fasting glucose: Secondary | ICD-10-CM | POA: Diagnosis not present

## 2022-09-13 DIAGNOSIS — I10 Essential (primary) hypertension: Secondary | ICD-10-CM | POA: Diagnosis not present

## 2022-09-13 LAB — COMPREHENSIVE METABOLIC PANEL
Albumin: 4.2 (ref 3.5–5.0)
Calcium: 9.6 (ref 8.7–10.7)
eGFR: 63

## 2022-09-13 LAB — CBC AND DIFFERENTIAL
HCT: 36 (ref 36–46)
Hemoglobin: 11.9 — AB (ref 12.0–16.0)
Platelets: 233 10*3/uL (ref 150–400)
WBC: 6

## 2022-09-13 LAB — BASIC METABOLIC PANEL
BUN: 14 (ref 4–21)
CO2: 30 — AB (ref 13–22)
Chloride: 105 (ref 99–108)
Creatinine: 0.9 (ref 0.5–1.1)
Glucose: 83
Potassium: 4.6 mEq/L (ref 3.5–5.1)
Sodium: 140 (ref 137–147)

## 2022-09-13 LAB — HEPATIC FUNCTION PANEL
ALT: 13 U/L (ref 7–35)
AST: 17 (ref 13–35)
Alkaline Phosphatase: 117 (ref 25–125)

## 2022-09-13 LAB — CBC: RBC: 4.62 (ref 3.87–5.11)

## 2022-09-13 LAB — HEMOGLOBIN A1C: Hemoglobin A1C: 5.3

## 2022-09-13 LAB — MICROALBUMIN, URINE: Microalb, Ur: 7.34

## 2022-10-13 DIAGNOSIS — D649 Anemia, unspecified: Secondary | ICD-10-CM | POA: Diagnosis not present

## 2022-10-14 LAB — CBC AND DIFFERENTIAL
HCT: 38 (ref 36–46)
Hemoglobin: 12.3 (ref 12.0–16.0)
Platelets: 222 10*3/uL (ref 150–400)
WBC: 5.5

## 2022-10-14 LAB — CBC: RBC: 4.76 (ref 3.87–5.11)

## 2022-10-14 LAB — IRON,TIBC AND FERRITIN PANEL: Iron: 82

## 2022-10-19 IMAGING — CR DG CHEST 2V
2 series · 2 of 2 positions shown · non-contrast
Comparison: None.

CLINICAL DATA: 75-year-old female with dyspnea on exertion

EXAM:
CHEST - 2 VIEW

[w chest pa]
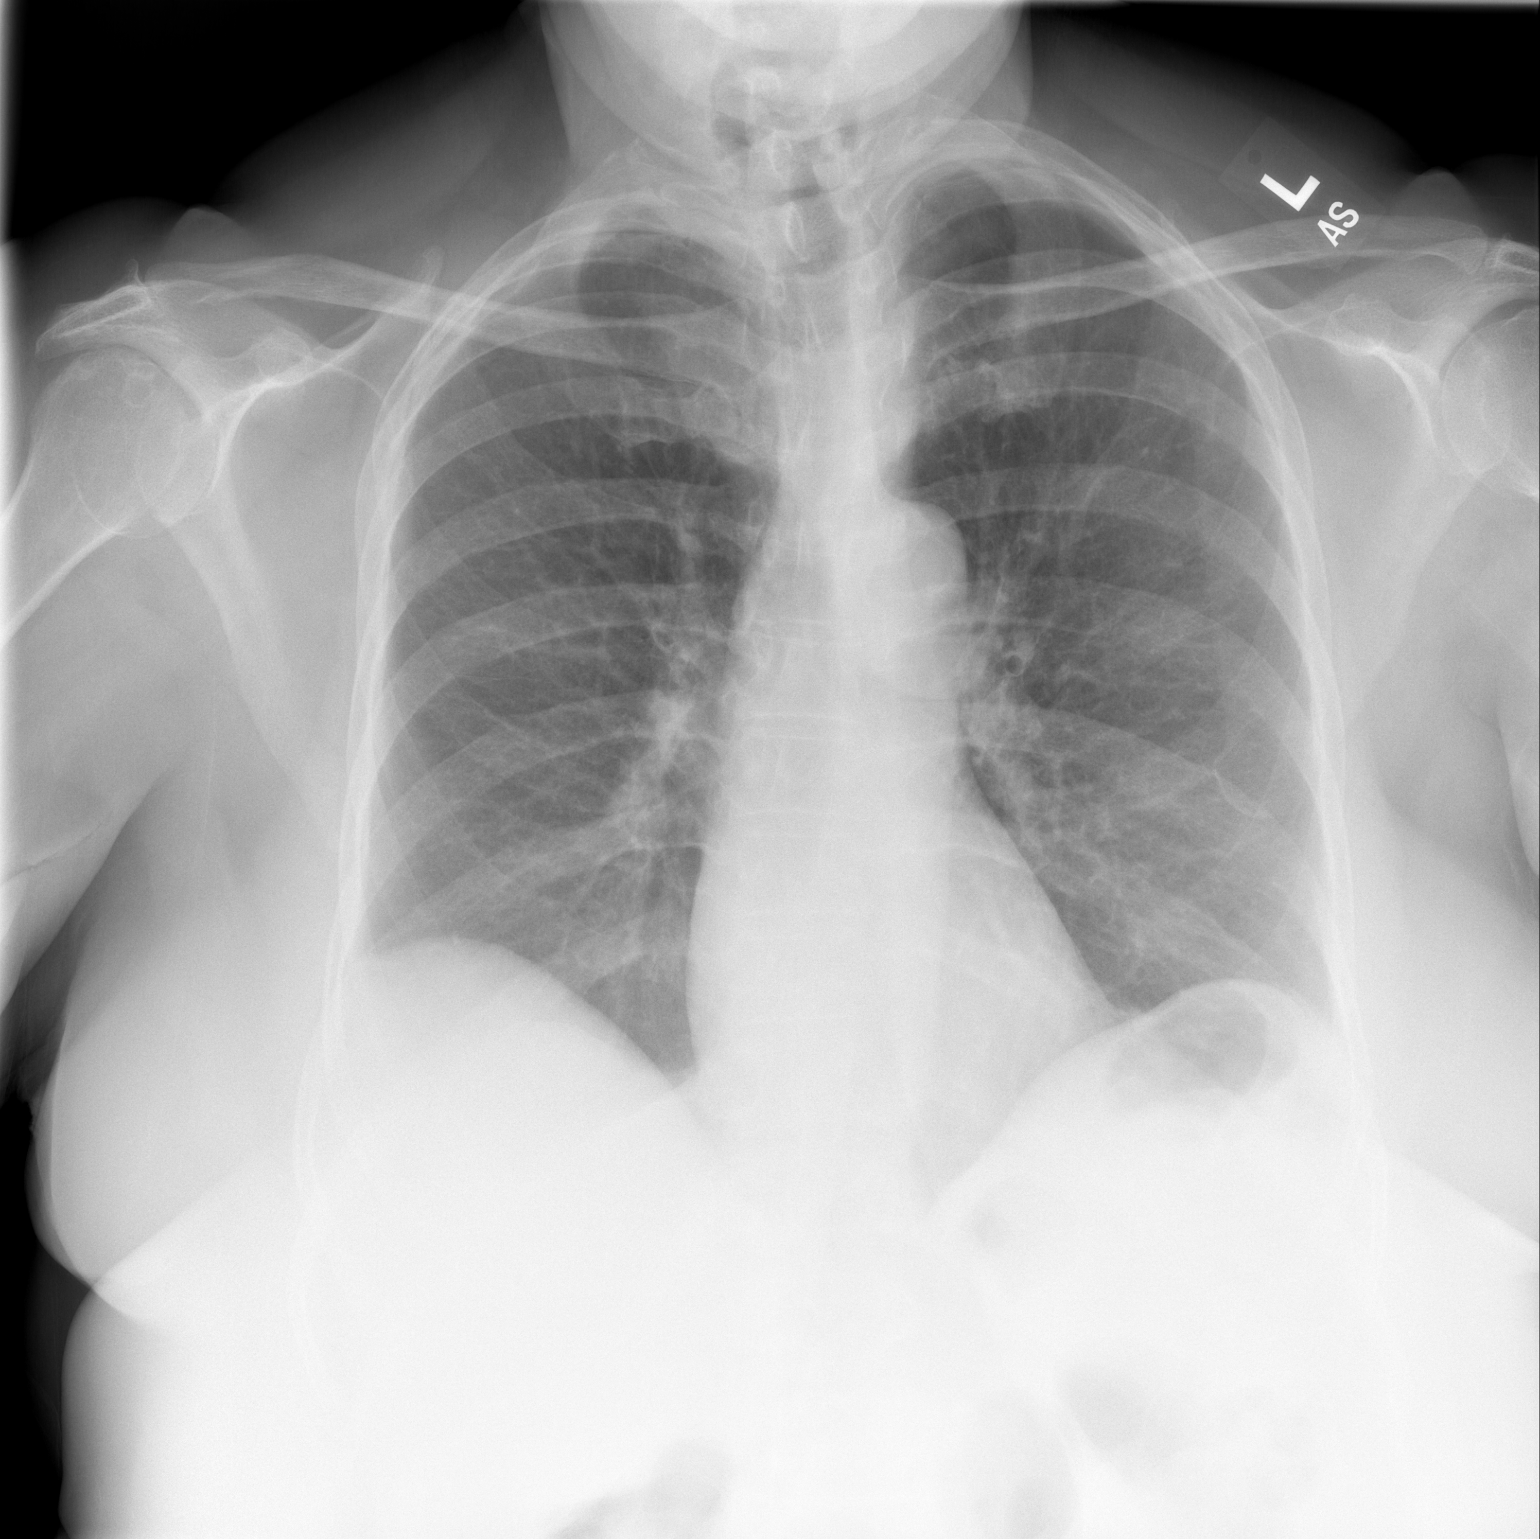

[w chest lat *]
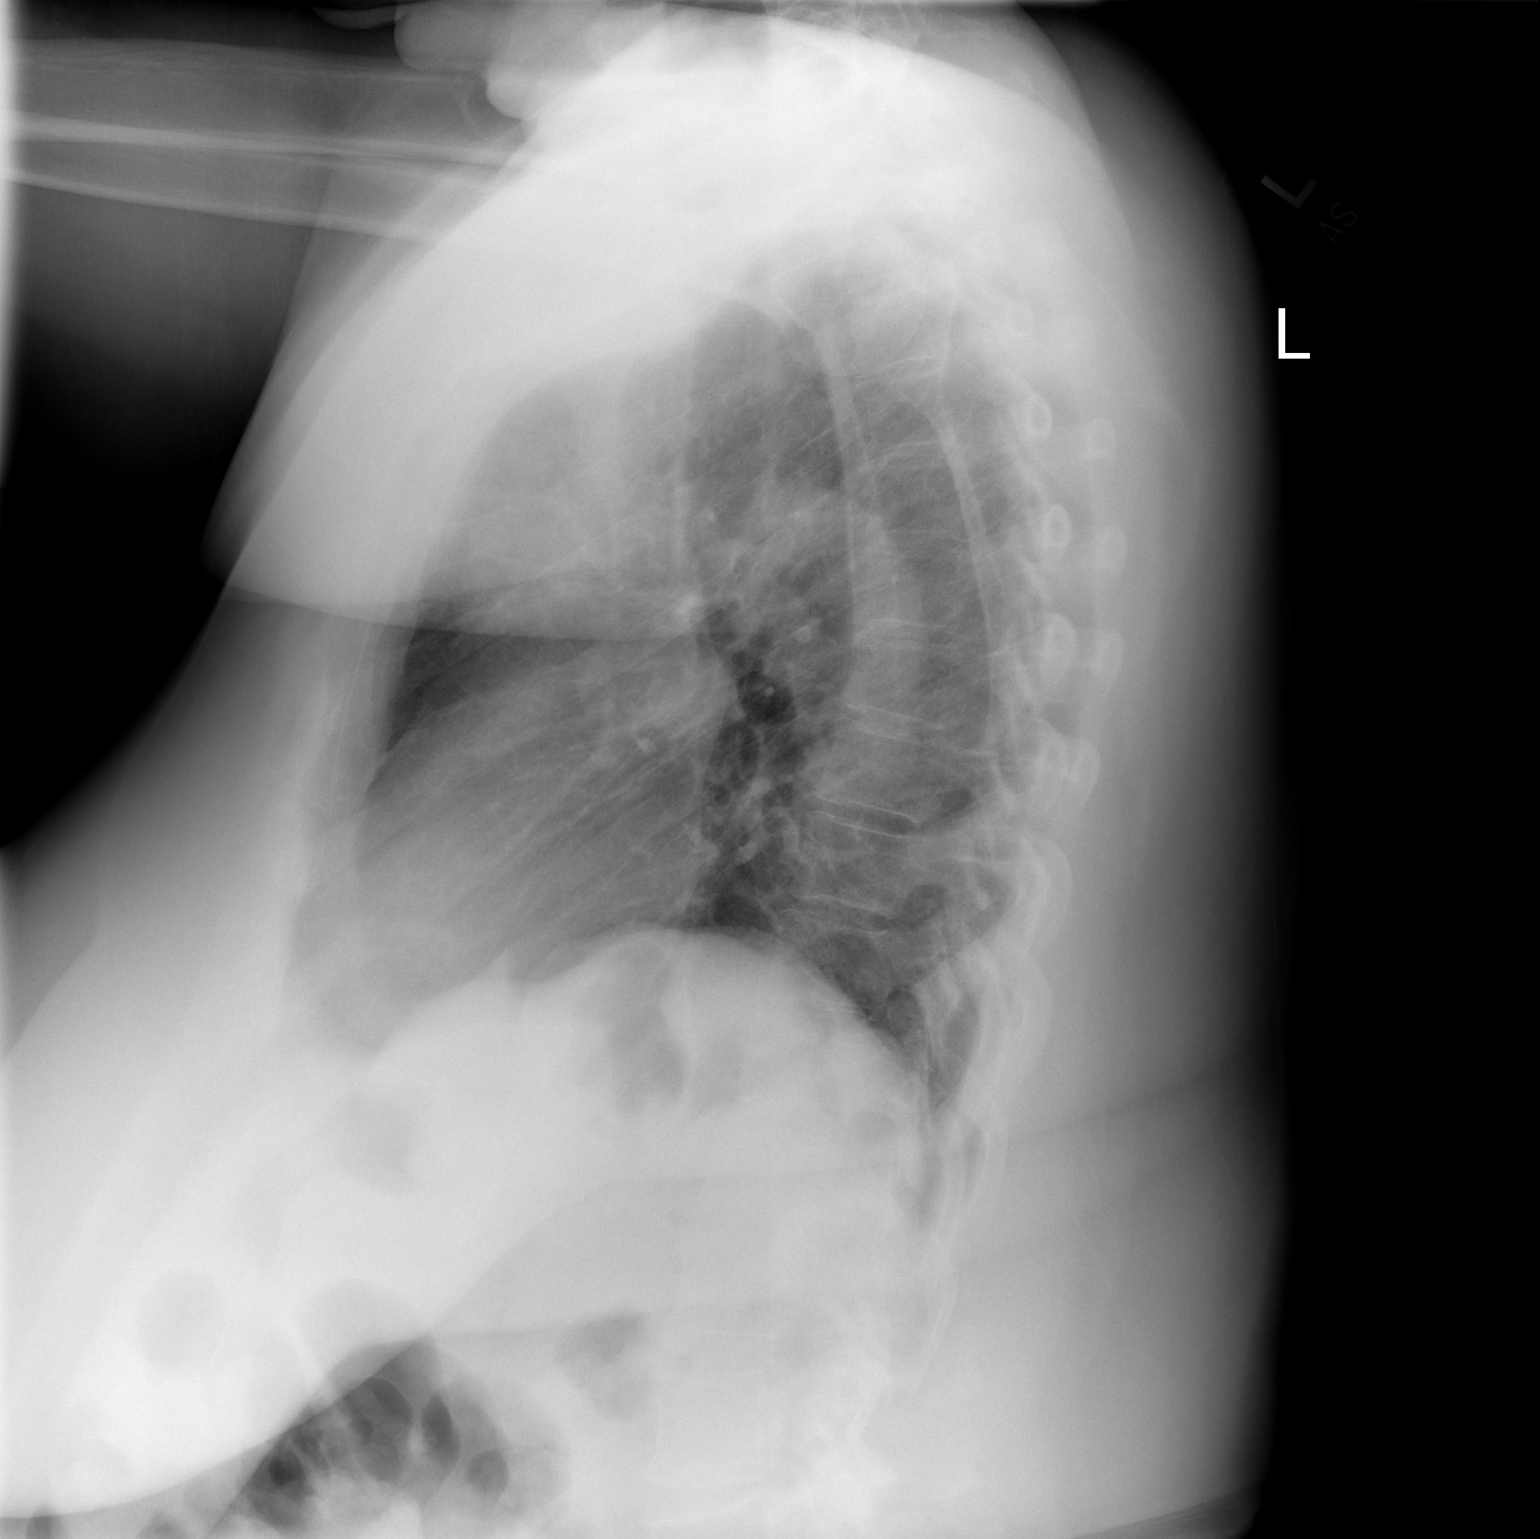

[2 of 2 positions shown; findings below may reference images not displayed]

FINDINGS: Cardiomediastinal silhouette within normal limits in size and
contour. No evidence of central vascular congestion. No interlobular
septal thickening.

No pneumothorax or pleural effusion. Coarsened interstitial
markings, with no confluent airspace disease.

No acute displaced fracture. Degenerative changes of the spine.
IMPRESSION: No active cardiopulmonary disease.

## 2022-11-02 IMAGING — CR DG LUMBAR SPINE 2-3V
2 series · 2 of 2 positions shown · non-contrast
Comparison: None.

CLINICAL DATA: Low back pain.

EXAM:
LUMBAR SPINE - 2-3 VIEW

[w l-spine a.p. *]
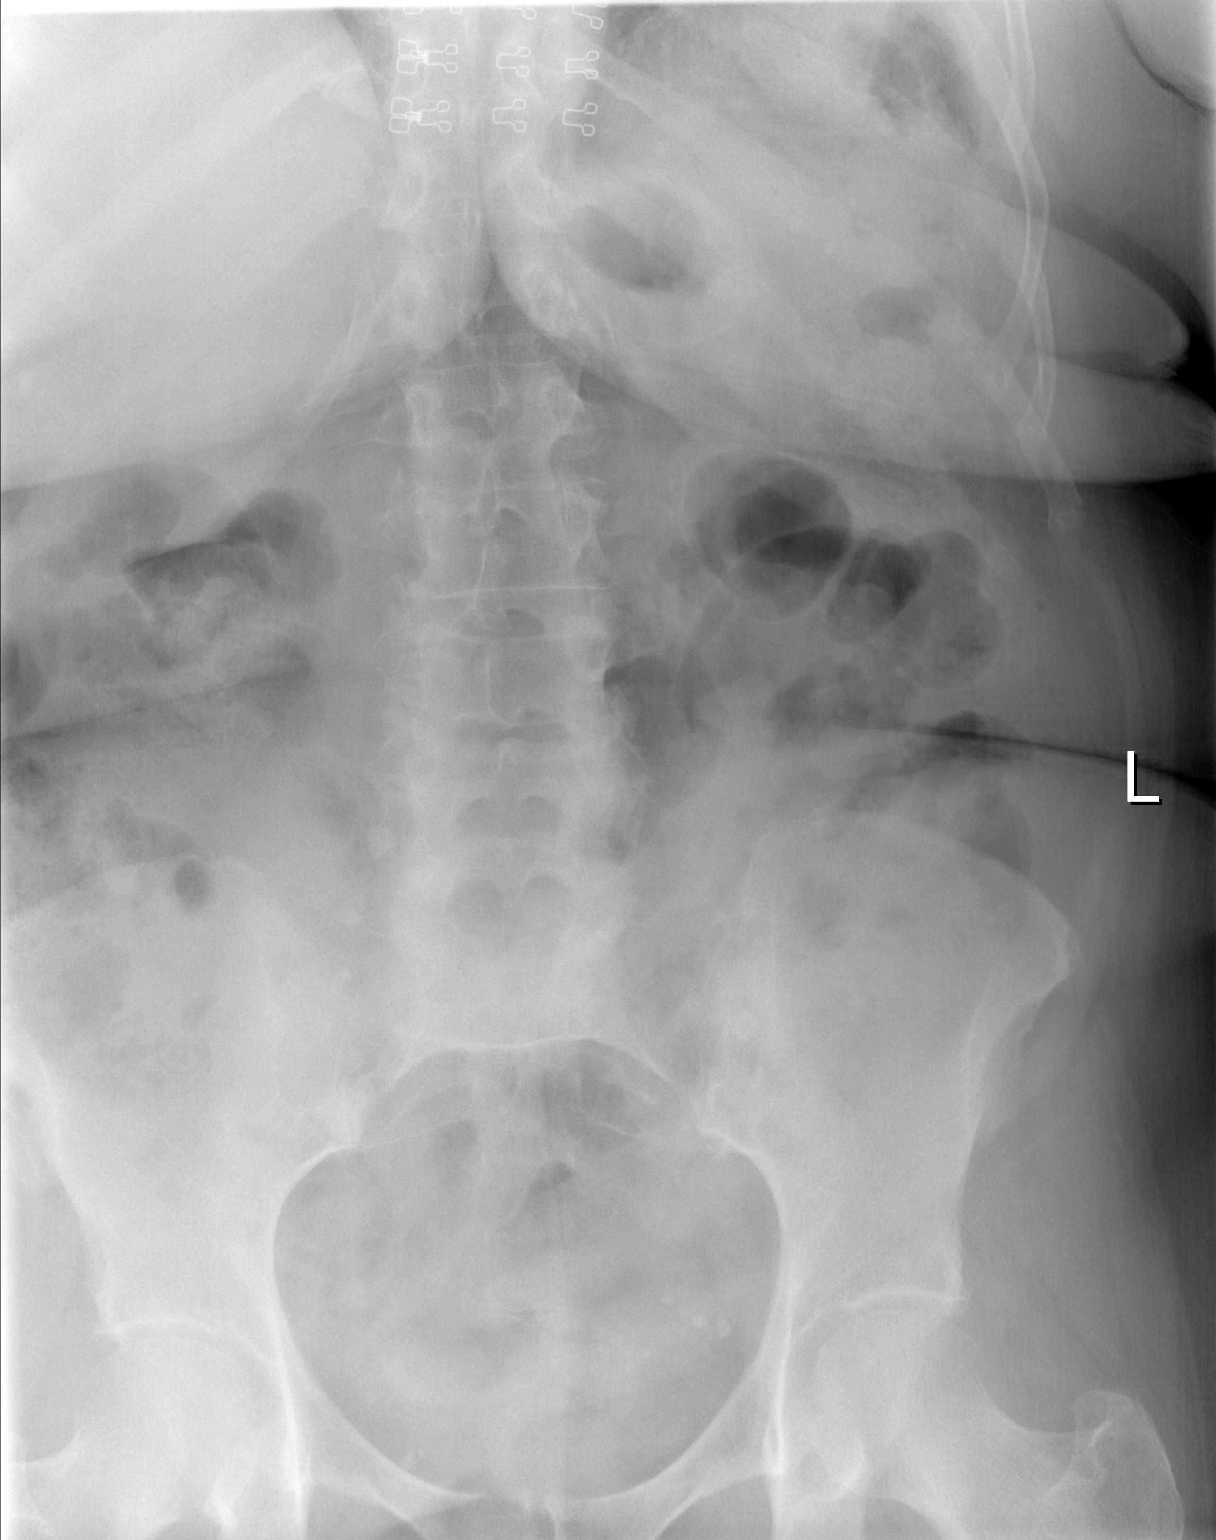

[w l-spine lat *]
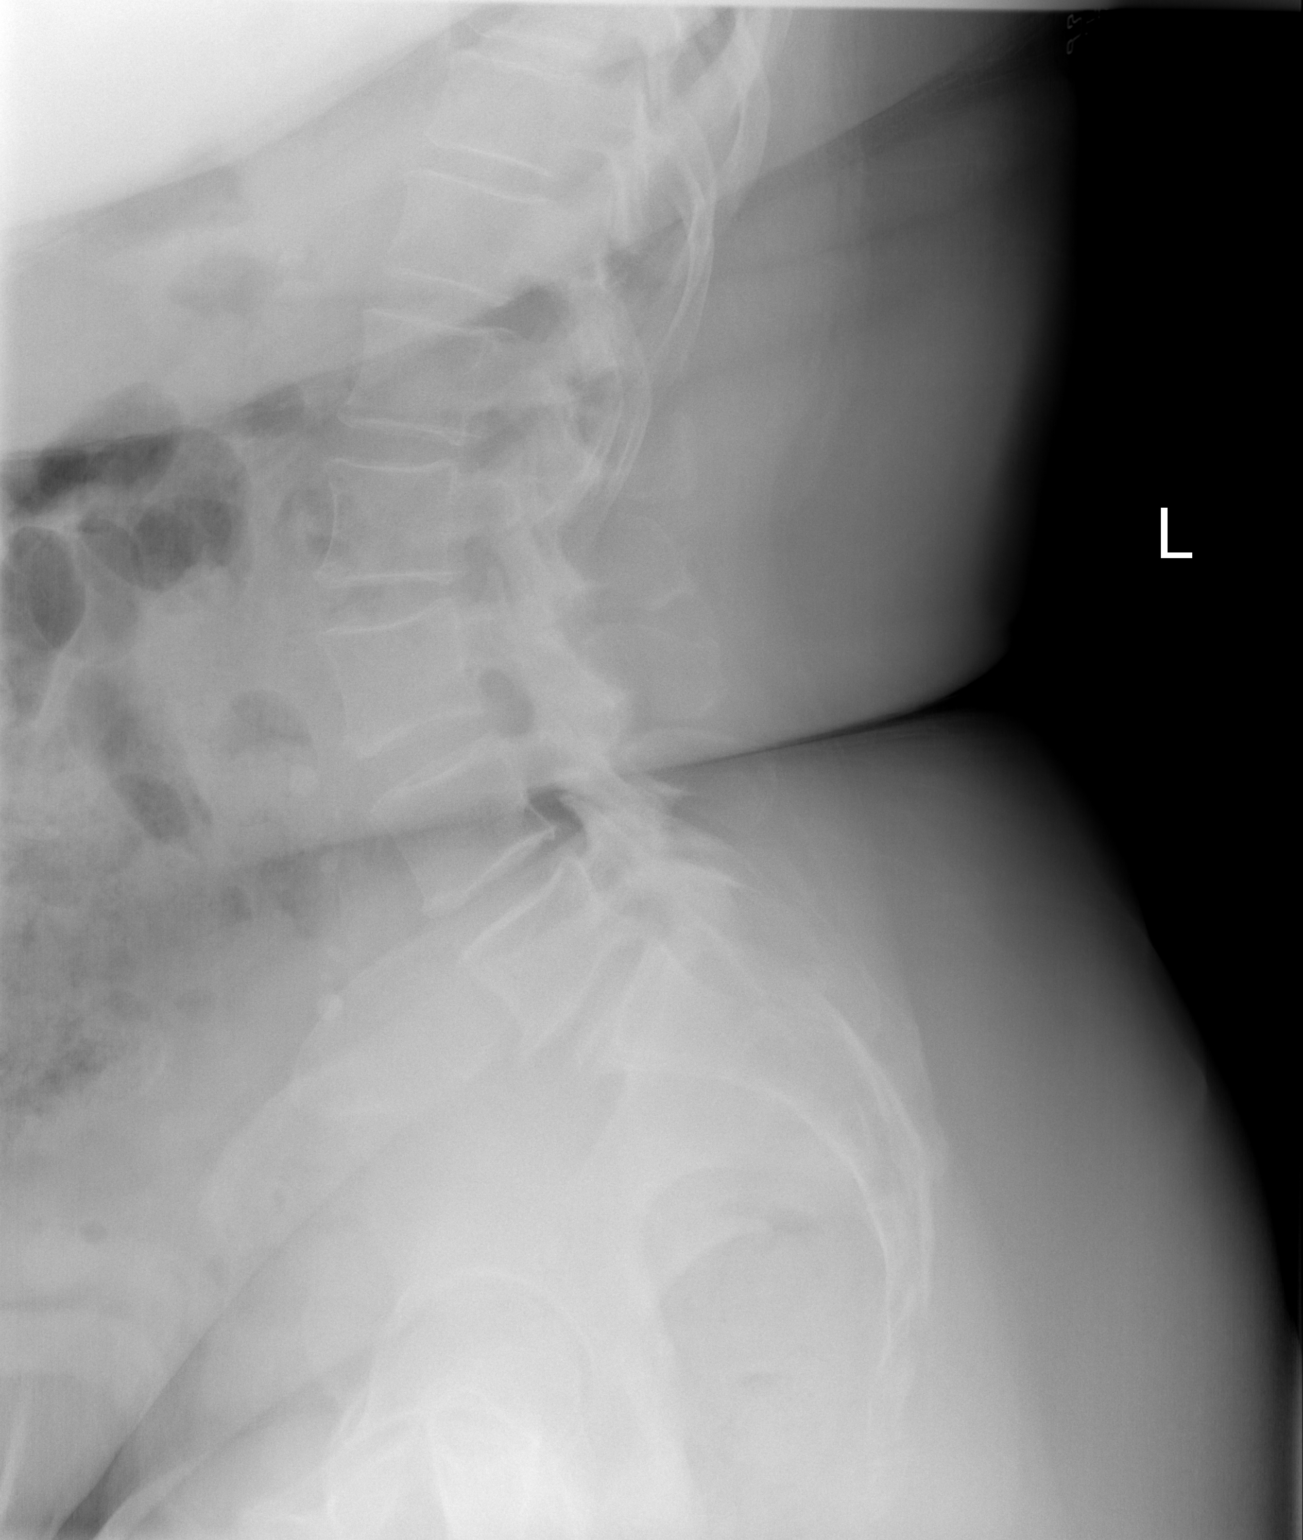

[2 of 2 positions shown; findings below may reference images not displayed]

FINDINGS: There is no evidence of lumbar spine fracture. Alignment is normal.
Intervertebral disc spaces are maintained. There are minimal
degenerative endplate changes at L4-L5 and L5-S1. Soft tissues are
within normal limits.
IMPRESSION: 1. No acute fracture or malalignment.
2. Minimal degenerative changes.

## 2022-11-08 ENCOUNTER — Encounter: Payer: Self-pay | Admitting: Bariatrics

## 2022-11-16 ENCOUNTER — Ambulatory Visit (INDEPENDENT_AMBULATORY_CARE_PROVIDER_SITE_OTHER): Payer: 59 | Admitting: Bariatrics

## 2022-11-16 ENCOUNTER — Encounter: Payer: Self-pay | Admitting: Bariatrics

## 2022-11-16 VITALS — BP 172/83 | HR 78 | Temp 98.0°F | Ht 65.0 in | Wt 243.0 lb

## 2022-11-16 DIAGNOSIS — R5383 Other fatigue: Secondary | ICD-10-CM | POA: Diagnosis not present

## 2022-11-16 DIAGNOSIS — E538 Deficiency of other specified B group vitamins: Secondary | ICD-10-CM | POA: Diagnosis not present

## 2022-11-16 DIAGNOSIS — I10 Essential (primary) hypertension: Secondary | ICD-10-CM

## 2022-11-16 DIAGNOSIS — Z0000 Encounter for general adult medical examination without abnormal findings: Secondary | ICD-10-CM | POA: Diagnosis not present

## 2022-11-16 DIAGNOSIS — E7849 Other hyperlipidemia: Secondary | ICD-10-CM

## 2022-11-16 DIAGNOSIS — E559 Vitamin D deficiency, unspecified: Secondary | ICD-10-CM | POA: Diagnosis not present

## 2022-11-16 DIAGNOSIS — Z1331 Encounter for screening for depression: Secondary | ICD-10-CM | POA: Diagnosis not present

## 2022-11-16 DIAGNOSIS — R0602 Shortness of breath: Secondary | ICD-10-CM | POA: Diagnosis not present

## 2022-11-16 DIAGNOSIS — Z0289 Encounter for other administrative examinations: Secondary | ICD-10-CM

## 2022-11-16 DIAGNOSIS — Z6841 Body Mass Index (BMI) 40.0 and over, adult: Secondary | ICD-10-CM

## 2022-11-17 LAB — TSH+T4F+T3FREE
Free T4: 1.29 ng/dL (ref 0.82–1.77)
T3, Free: 3 pg/mL (ref 2.0–4.4)
TSH: 2.34 u[IU]/mL (ref 0.450–4.500)

## 2022-11-17 LAB — INSULIN, RANDOM: INSULIN: 21.4 u[IU]/mL (ref 2.6–24.9)

## 2022-11-17 LAB — VITAMIN D 25 HYDROXY (VIT D DEFICIENCY, FRACTURES): Vit D, 25-Hydroxy: 64.8 ng/mL (ref 30.0–100.0)

## 2022-11-17 LAB — VITAMIN B12: Vitamin B-12: >2000 pg/mL — ABNORMAL HIGH (ref 232–1245)

## 2022-11-21 NOTE — Progress Notes (Signed)
Chief Complaint:   OBESITY Allison Gentry (MR# [id number]) is a 77 y.o. female who presents for evaluation and treatment of obesity and related comorbidities. Current BMI is Body mass index is 40.44 kg/m. Allison Gentry has been struggling with her weight for many years and has been unsuccessful in either losing weight, maintaining weight loss, or reaching her healthy weight goal.  Allison Gentry is currently in the action stage of change and ready to dedicate time achieving and maintaining a healthier weight. Allison Gentry is interested in becoming our patient and working on intensive lifestyle modifications including (but not limited to) diet and exercise for weight loss.  Allison Gentry's habits were reviewed today and are as follows: her desired weight loss is 98 lbs, she started gaining weight over the years, her heaviest weight ever was 250 pounds, she is frequently drinking liquids with calories, and she struggles with emotional eating.  Depression Screen Allison Gentry's Food and Mood (modified PHQ-9) score was 1.  Subjective:   1. Other fatigue Allison Gentry admits to daytime somnolence and denies waking up still tired. Patient has a history of symptoms of daytime fatigue. Allison Gentry generally gets 8 hours of sleep per night, and states that she has generally restful sleep. Snoring is not present. Apneic episodes are not present. Epworth Sleepiness Score is 1.   2. SOB (shortness of breath) on exertion Allison Gentry notes increasing shortness of breath with exercising and seems to be worsening over time with weight gain. She notes getting out of breath sooner with activity than she used to. This has not gotten worse recently. Allison Gentry denies shortness of breath at rest or orthopnea.  3. Essential hypertension Patient's blood pressure is elevated today, and she notes she was rushing.  She is taking amlodipine.  Her blood pressures at home range between 120's-130's/60-70.  4. Vitamin D deficiency Patient is taking multivitamins.  5. B12  deficiency Patient is taking multivitamins.  6. Other hyperlipidemia Patient is taking Crestor.  7. Health care maintenance Given obesity.   Assessment/Plan:   1. Other fatigue Allison Gentry does feel that her weight is causing her energy to be lower than it should be. Fatigue may be related to obesity, depression or many other causes. Labs will be ordered, and in the meanwhile, Allison Gentry will focus on self care including making healthy food choices, increasing physical activity and focusing on stress reduction.  - EKG 12-Lead - TSH+T4F+T3Free  2. SOB (shortness of breath) on exertion Allison Gentry does feel that she gets out of breath more easily that she used to when she exercises. Allison Gentry's shortness of breath appears to be obesity related and exercise induced. She has agreed to work on weight loss and gradually increase exercise to treat her exercise induced shortness of breath. Will continue to monitor closely.  - TSH+T4F+T3Free  3. Essential hypertension Patient will continue her medications, and will work on eliminating added salt.  4. Vitamin D deficiency We will check labs today, and we will follow-up at patient's next visit.  - VITAMIN D 25 Hydroxy (Vit-D Deficiency, Fractures)  5. B12 deficiency We will check labs today, we will follow-up at patient's next visit.  - Vitamin B12  6. Other hyperlipidemia Patient will continue Crestor as directed.  7. Health care maintenance We will check labs today. EKG and IC were done today, and reviewed with the patient.   - Insulin, random - Vitamin B12 - VITAMIN D 25 Hydroxy (Vit-D Deficiency, Fractures) - TSH+T4F+T3Free  8. Depression screening Allison Gentry had a negative depression screening.  9. Morbid obesity (HCC)  10. BMI 40.0-44.9, adult (HCC) - Insulin, random - Vitamin B12 - VITAMIN D 25 Hydroxy (Vit-D Deficiency, Fractures) - TSH+T4F+T3Free  Allison Gentry is currently in the action stage of change and her goal is to continue with weight  loss efforts. I recommend Allison Gentry begin the structured treatment plan as follows:  She has agreed to the Category 2 Plan.  Exercise goals: No exercise has been prescribed at this time.   Behavioral modification strategies: increasing lean protein intake, decreasing simple carbohydrates, increasing vegetables, increasing water intake, decreasing eating out, no skipping meals, meal planning and cooking strategies, keeping healthy foods in the home, and planning for success.  She was informed of the importance of frequent follow-up visits to maximize her success with intensive lifestyle modifications for her multiple health conditions. She was informed we would discuss her lab results at her next visit unless there is a critical issue that needs to be addressed sooner. Allison Gentry agreed to keep her next visit at the agreed upon time to discuss these results.  Objective:   Blood pressure (!) 172/83, pulse 78, temperature 98 F (36.7 C), height 5\' 5"  (1.651 m), weight 243 lb (110.2 kg), SpO2 99 %. Body mass index is 40.44 kg/m.  EKG: Normal sinus rhythm, rate 74 BPM.  Indirect Calorimeter completed today shows a VO2 of 217 and a REE of 1498.  Her calculated basal metabolic rate is 1914 thus her basal metabolic rate is worse than expected.  General: Cooperative, alert, well developed, in no acute distress. HEENT: Conjunctivae and lids unremarkable. Cardiovascular: Regular rhythm.  Lungs: Normal work of breathing. Neurologic: No focal deficits.   No results found for: "CREATININE", "BUN", "NA", "K", "CL", "CO2" No results found for: "ALT", "AST", "GGT", "ALKPHOS", "BILITOT" No results found for: "HGBA1C" Lab Results  Component Value Date   INSULIN 21.4 11/16/2022   Lab Results  Component Value Date   TSH 2.340 11/16/2022   No results found for: "CHOL", "HDL", "LDLCALC", "LDLDIRECT", "TRIG", "CHOLHDL" No results found for: "WBC", "HGB", "HCT", "MCV", "PLT" No results found for: "IRON",  "TIBC", "FERRITIN"  Attestation Statements:   Reviewed by clinician on day of visit: allergies, medications, problem list, medical history, surgical history, family history, social history, and previous encounter notes.   Trude Mcburney, am acting as Energy manager for [employer], DO.  I have reviewed the above documentation for accuracy and completeness, and I agree with the above. Allison Capra, DO

## 2022-11-22 ENCOUNTER — Telehealth: Payer: Self-pay

## 2022-11-22 IMAGING — CT CT CARDIAC CORONARY ARTERY CALCIUM SCORE
3 series · 14 of 20 positions shown, 16 images · non-contrast
Comparison: None.

CLINICAL DATA: 75-year-old African American female with history of
hyperlipidemia, hypertension and prior smoking history.

EXAM:
CT CARDIAC CORONARY ARTERY CALCIUM SCORE
TECHNIQUE: Non-contrast imaging through the heart was performed using
prospective ECG gating. Image post processing was performed on an
independent workstation, allowing for quantitative analysis of the
heart and coronary arteries. Note that this exam targets the heart
and the chest was not imaged in its entirety.

[Series 2: calcium scoring 2.00 qr36 bestdiast 72% hrt calciu · axial · 0.42mm/px · z∈[+1881,+1965]mm · 4 of 70 slices shown]
[im 14/70  vessel]
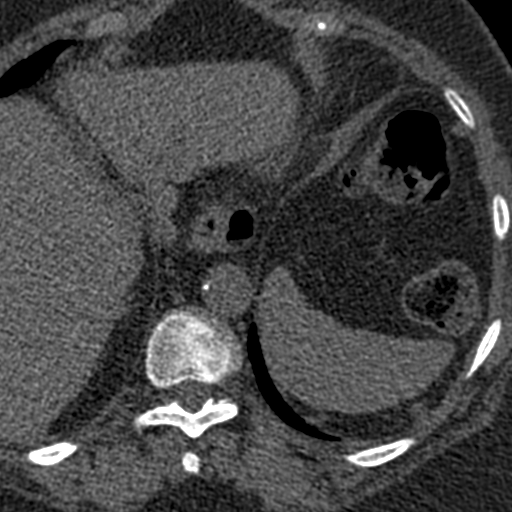
[im 28/70  vessel]
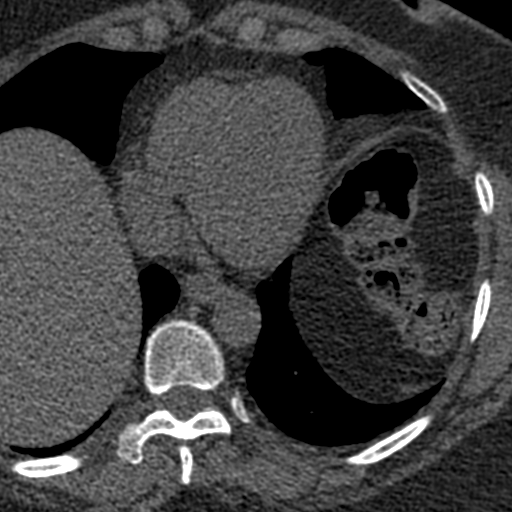
[im 42/70  vessel]
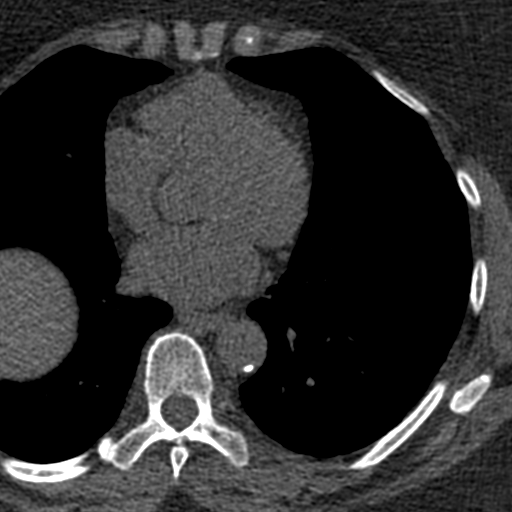
[im 56/70  vessel]
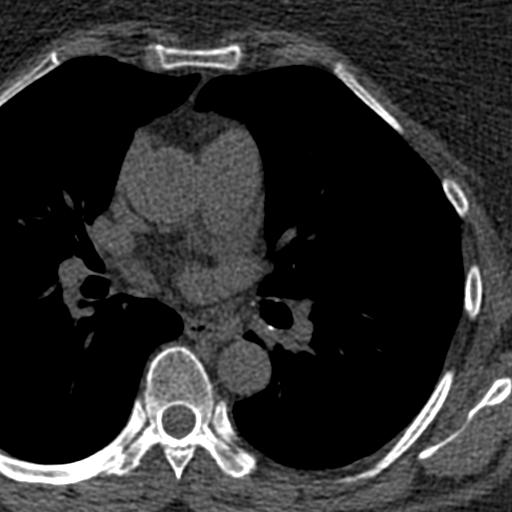

[Series 3: calcium scoring 2.00 br40 bestdiast 72% axial · axial · 0.61mm/px · z∈[+1877,+1969]mm · 5 of 70 slices shown, 7 images]
[im 12/70  vessel]
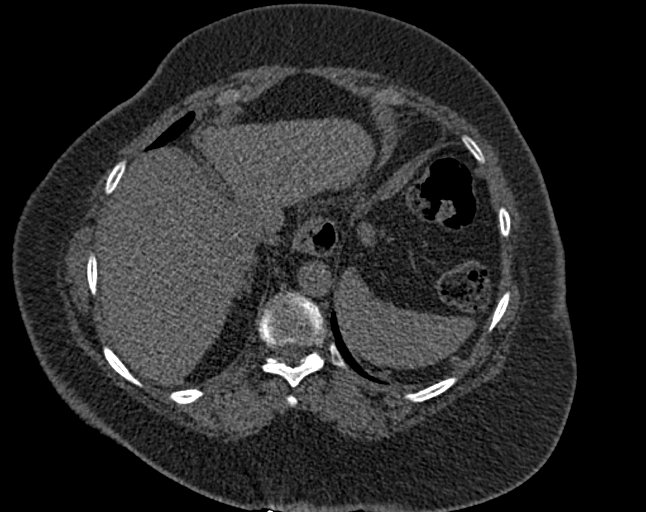
[im 12/70  lung]
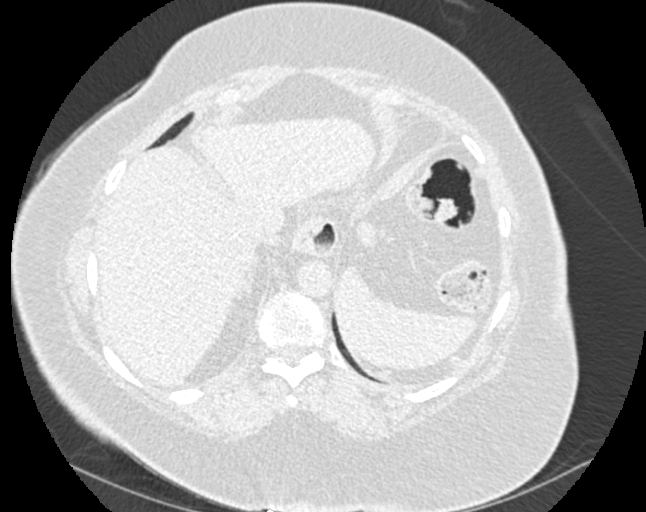
[im 24/70  vessel]
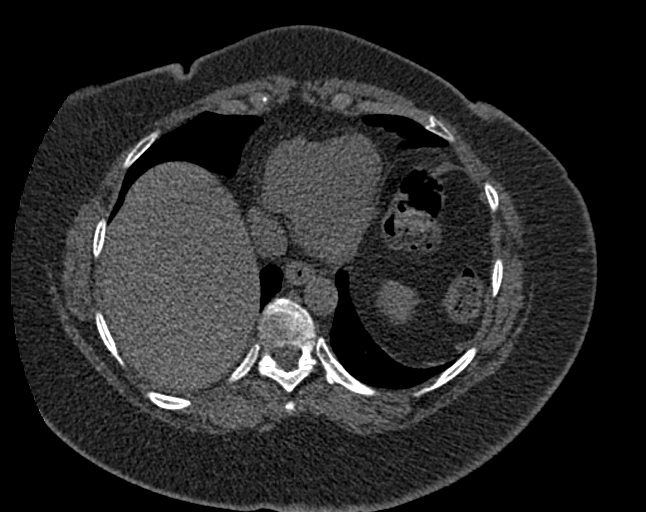
[im 35/70  vessel]
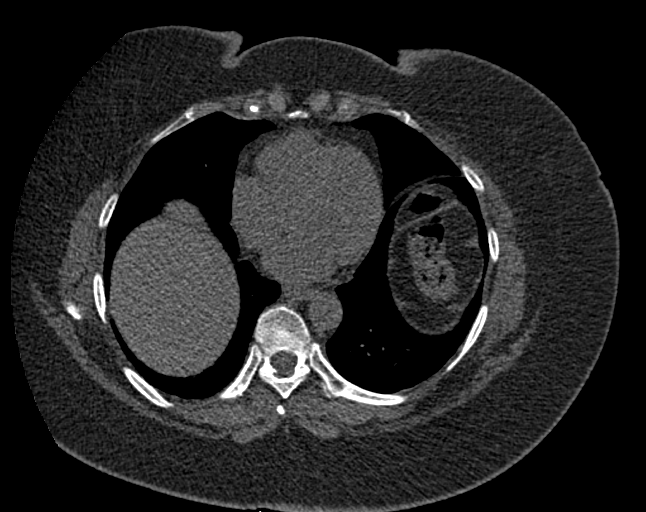
[im 47/70  vessel]
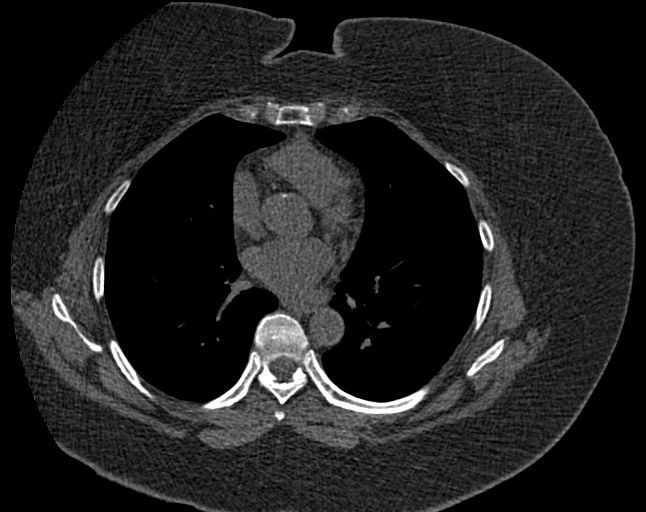
[im 58/70  vessel]
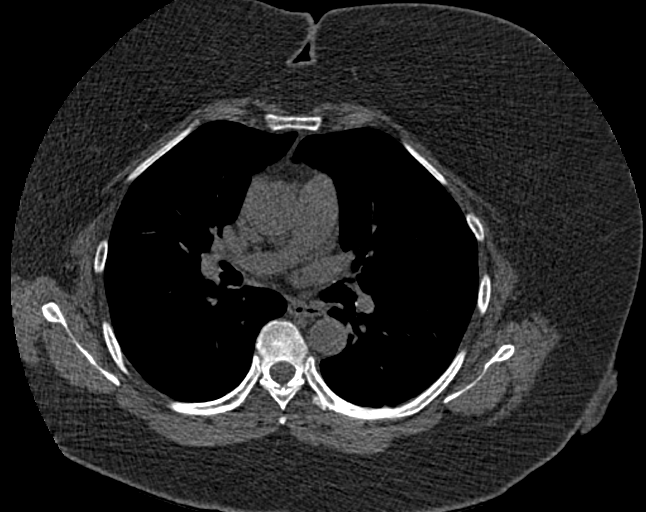
[im 58/70  lung]
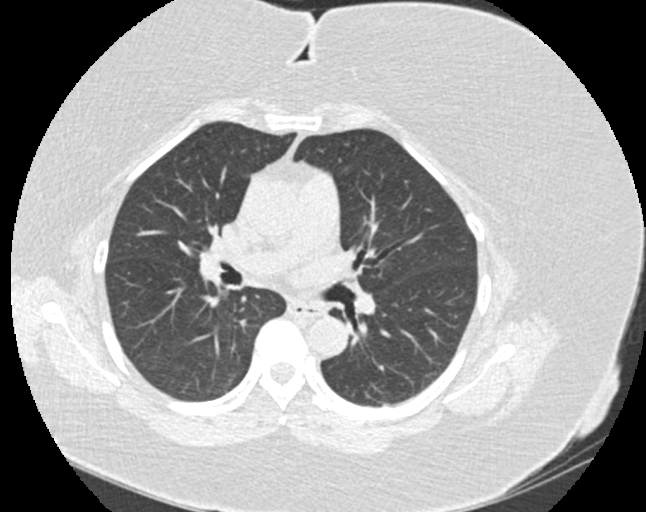

[Series 9: calcium scoring 2.00 br60 bestdiast 72% lungs · axial · 0.61mm/px · z∈[+1877,+1969]mm · 5 of 70 slices shown]
[im 12/70  vessel]
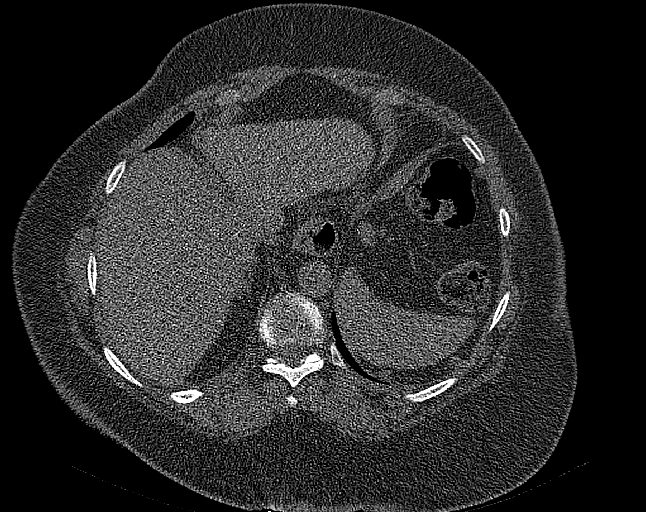
[im 24/70  vessel]
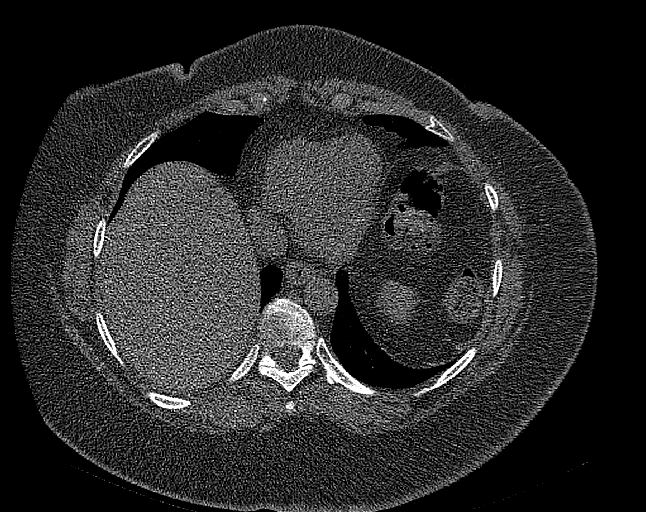
[im 35/70  vessel]
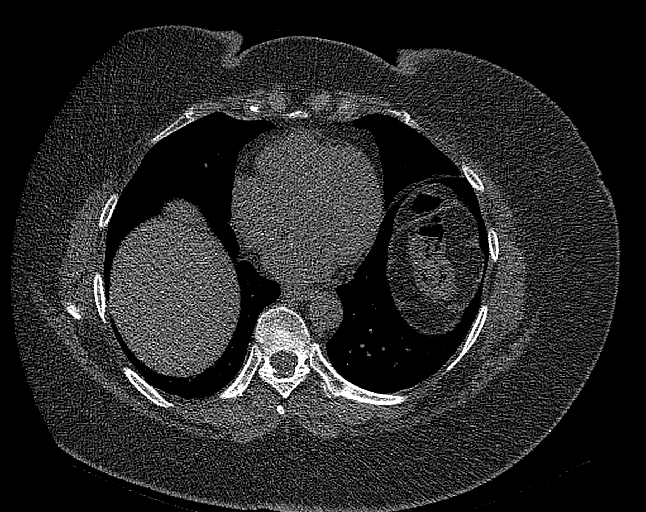
[im 47/70  vessel]
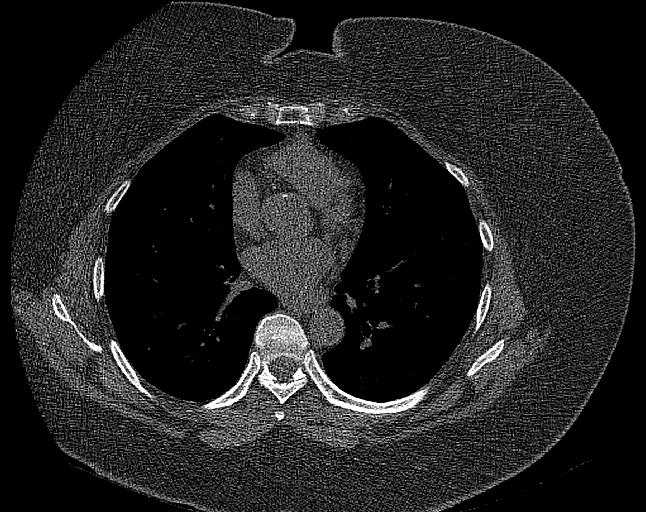
[im 58/70  vessel]
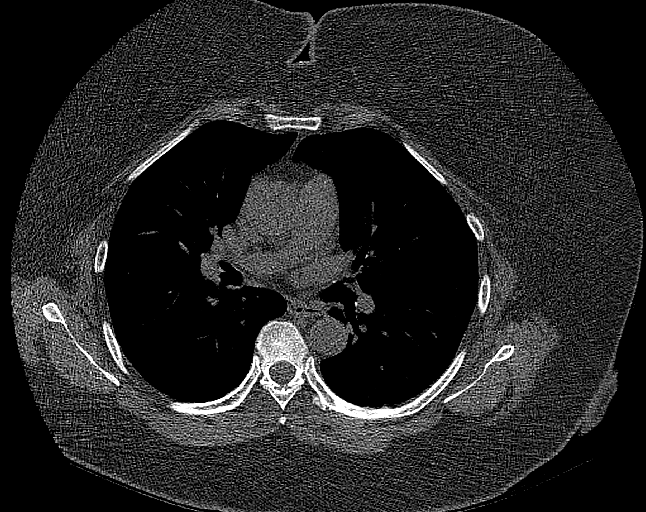

[14 of 20 positions shown; findings below may reference images not displayed]

FINDINGS: CORONARY CALCIUM SCORES:

Left Main: 0

LAD: 19

LCx: 0

RCA:

Total Agatston Score:

[HOSPITAL] percentile: 51

AORTA MEASUREMENTS:

Ascending Aorta: 31 mm

Descending Aorta: 22 mm

OTHER FINDINGS:

The heart size is within normal limits. No pericardial fluid is
identified. Visualized segments of the thoracic aorta and central
pulmonary arteries are normal in caliber. There is mild
atherosclerosis of the thoracic aorta. Visualized mediastinum and
hilar regions demonstrate no lymphadenopathy or masses. There is a
small hiatal hernia. Visualized lungs show no evidence of pulmonary
edema, consolidation, pneumothorax, nodule or pleural fluid.
Visualized upper abdomen and bony structures are unremarkable.
IMPRESSION: Coronary calcium score of 20.8 is at the 51st percentile for the
patient's age, sex and race.

## 2022-11-22 NOTE — Telephone Encounter (Signed)
-----   Message from [place] Nickel, DO sent at 11/17/2022  7:39 AM EDT ----- Regarding: B12 Call pt. Tell her to back off a little on her B12 (not to worry B12 is water soluble). No buildup over time.  ----- Message ----- From: Nell [facility] Lab Results In Sent: 11/17/2022   5:39 AM EDT To: [place] Nickel, DO

## 2022-11-22 NOTE — Telephone Encounter (Signed)
Please call patient back regarding this. She is returning a call

## 2022-11-23 NOTE — Telephone Encounter (Signed)
Notified patient of Dr. Browns recommendations. Patient verbalized understanding.

## 2022-11-30 ENCOUNTER — Encounter: Payer: Self-pay | Admitting: Nurse Practitioner

## 2022-11-30 ENCOUNTER — Ambulatory Visit (INDEPENDENT_AMBULATORY_CARE_PROVIDER_SITE_OTHER): Payer: 59 | Admitting: Nurse Practitioner

## 2022-11-30 VITALS — BP 178/82 | HR 83 | Temp 98.1°F | Ht 65.0 in | Wt 243.0 lb

## 2022-11-30 DIAGNOSIS — Z6841 Body Mass Index (BMI) 40.0 and over, adult: Secondary | ICD-10-CM

## 2022-11-30 DIAGNOSIS — E88819 Insulin resistance, unspecified: Secondary | ICD-10-CM | POA: Diagnosis not present

## 2022-11-30 DIAGNOSIS — I10 Essential (primary) hypertension: Secondary | ICD-10-CM

## 2022-11-30 DIAGNOSIS — R7989 Other specified abnormal findings of blood chemistry: Secondary | ICD-10-CM

## 2022-11-30 NOTE — Progress Notes (Signed)
Office: [phone]  /  Fax: [phone]  WEIGHT SUMMARY AND BIOMETRICS  Weight Lost Since Last Visit: 0lb  Weight Gained Since Last Visit: 0lb   Vitals Temp: 98.1 F (36.7 C) BP: (!) 178/82 Pulse Rate: 83 SpO2: 99 %   Anthropometric Measurements Height: 5\' 5"  (1.651 m) Weight: 243 lb (110.2 kg) BMI (Calculated): 40.44 Weight at Last Visit: 243lb Weight Lost Since Last Visit: 0lb Weight Gained Since Last Visit: 0lb Starting Weight: 243lb Total Weight Loss (lbs): 0 lb (0 kg)   Body Composition  Body Fat %: 49.5 % Fat Mass (lbs): 120.4 lbs Muscle Mass (lbs): 116.8 lbs Total Body Water (lbs): 82 lbs Visceral Fat Rating : 18   Other Clinical Data Fasting: No Labs: No Today's Visit #: 2 Starting Date: 11/16/22     HPI  Chief Complaint: OBESITY  Allison Gentry is here to discuss her progress with her obesity treatment plan. She is on the the Category 2 Plan and states she is following her eating plan approximately 90 % of the time. She states she is exercising 0 minutes 0 days per week.   Interval History:  Since last office visit she has maintained her weight.  Doing well with cat 2 plan.  She is drinking 1 coke and water daily.     Pharmacotherapy for weight loss: She is not currently taking medications  for medical weight loss.   Bariatric surgery:  Patient has not had bariatric surgery.  Hypertension Hypertension uncontrolled.  Medication(s): Norvasc 5mg .  Denies side effects.   Denies chest pain, palpitations and SOB. She has been monitoring her BP at home-wrist:  122-124/45-64  BP Readings from Last 3 Encounters:  11/30/22 (!) 178/82  11/16/22 (!) 172/83  08/02/21 (!) 155/84   No results found for: "CREATININE"     High Vit B Was taking Vit B12 2,500 and has stopped taking since her last visit  Insulin Resistance Last fasting insulin was 21.4. Polyphagia:Yes Medication(s): none  No results found for: "HGBA1C" Lab Results  Component Value  Date   INSULIN 21.4 11/16/2022   PHYSICAL EXAM:  Blood pressure (!) 178/82, pulse 83, temperature 98.1 F (36.7 C), height 5\' 5"  (1.651 m), weight 243 lb (110.2 kg), SpO2 99 %. Body mass index is 40.44 kg/m.  General: She is overweight, cooperative, alert, well developed, and in no acute distress. PSYCH: Has normal mood, affect and thought process.   Extremities: No edema.  Neurologic: No gross sensory or motor deficits. No tremors or fasciculations noted.    DIAGNOSTIC DATA REVIEWED:  BMET No results found for: "NA", "K", "CL", "CO2", "GLUCOSE", "BUN", "CREATININE", "CALCIUM", "GFRNONAA", "GFRAA" No results found for: "HGBA1C" Lab Results  Component Value Date   INSULIN 21.4 11/16/2022   Lab Results  Component Value Date   TSH 2.340 11/16/2022   CBC No results found for: "WBC", "RBC", "HGB", "HCT", "PLT", "MCV", "MCH", "MCHC", "RDW" Iron Studies No results found for: "IRON", "TIBC", "FERRITIN", "IRONPCTSAT" Lipid Panel  No results found for: "CHOL", "TRIG", "HDL", "CHOLHDL", "VLDL", "LDLCALC", "LDLDIRECT" Hepatic Function Panel  No results found for: "PROT", "ALBUMIN", "AST", "ALT", "ALKPHOS", "BILITOT", "BILIDIR", "IBILI"    Component Value Date/Time   TSH 2.340 11/16/2022 0951   Nutritional Lab Results  Component Value Date   VD25OH 64.8 11/16/2022     ASSESSMENT AND PLAN  TREATMENT PLAN FOR OBESITY:  Recommended Dietary Goals  Keilly is currently in the action stage of change. As such, her goal is to continue weight management plan.  She has agreed to the Category 2 Plan.  Behavioral Intervention  We discussed the following Behavioral Modification Strategies today: increasing lean protein intake, decreasing simple carbohydrates , increasing vegetables, increasing lower glycemic fruits, increasing water intake, emotional eating strategies and understanding the difference between hunger signals and cravings, continue to practice mindfulness when eating, and  planning for success.  Additional resources provided today: protein take, 100 & 200 snack ideas, dining out guidelines, prediabetes/IR and Metformin.  Recommended Physical Activity Goals  Caylor has been advised to work up to 150 minutes of moderate intensity aerobic activity a week and strengthening exercises 2-3 times per week for cardiovascular health, weight loss maintenance and preservation of muscle mass.   She has agreed to Think about ways to increase daily physical activity and overcoming barriers to exercise and Increase physical activity in their day and reduce sedentary time (increase NEAT).   ASSOCIATED CONDITIONS ADDRESSED TODAY  Action/Plan  Essential hypertension To follow up with PCP.  Continue meds as directed She feels her BP is elevated today because she is nervous   Insulin resistance Madalyne will continue to work on weight loss, exercise, and decreasing simple carbohydrates to help decrease the risk of diabetes. Jeania agreed to follow-up with [place] as directed to closely monitor her progress.   Handouts given on insulin resistance/prediabetes and Metformin  High serum Vit B12 Patient has stopped taking Vit B12 since receiving lab results.  Will continue to monitor  Morbid obesity (HCC)  BMI 40.0-44.9, adult (HCC)      Labs reviewed with patient from 11/16/22   Return in about 2 weeks (around 12/14/2022).Marland Kitchen She was informed of the importance of frequent follow up visits to maximize her success with intensive lifestyle modifications for her multiple health conditions.   ATTESTASTION STATEMENTS:  Reviewed by clinician on day of visit: allergies, medications, problem list, medical history, surgical history, family history, social history, and previous encounter notes.   Time spent on visit including pre-visit chart review and post-visit care and charting was 30 minutes.    Theodis Sato. Felesha Moncrieffe FNP-C

## 2022-12-22 ENCOUNTER — Other Ambulatory Visit (HOSPITAL_BASED_OUTPATIENT_CLINIC_OR_DEPARTMENT_OTHER): Payer: Self-pay

## 2022-12-22 ENCOUNTER — Ambulatory Visit (INDEPENDENT_AMBULATORY_CARE_PROVIDER_SITE_OTHER): Payer: 59 | Admitting: Bariatrics

## 2022-12-22 ENCOUNTER — Encounter: Payer: Self-pay | Admitting: Bariatrics

## 2022-12-22 VITALS — BP 150/84 | HR 86 | Temp 98.2°F | Ht 65.0 in | Wt 242.0 lb

## 2022-12-22 DIAGNOSIS — I10 Essential (primary) hypertension: Secondary | ICD-10-CM

## 2022-12-22 DIAGNOSIS — Z6841 Body Mass Index (BMI) 40.0 and over, adult: Secondary | ICD-10-CM | POA: Diagnosis not present

## 2022-12-22 DIAGNOSIS — E88819 Insulin resistance, unspecified: Secondary | ICD-10-CM | POA: Diagnosis not present

## 2022-12-22 MED ORDER — METFORMIN HCL 500 MG PO TABS
500.0000 mg | ORAL_TABLET | Freq: Two times a day (BID) | ORAL | 0 refills | Status: DC
  Filled 2022-12-22: qty 60, 30d supply, fill #0

## 2022-12-22 NOTE — Progress Notes (Signed)
WEIGHT SUMMARY AND BIOMETRICS  Weight Lost Since Last Visit: 1lb  Vitals Temp: 98.2 F (36.8 C) BP: (!) 168/88 Pulse Rate: 86 SpO2: 99 %   Anthropometric Measurements Height: 5\' 5"  (1.651 m) Weight: 242 lb (109.8 kg) BMI (Calculated): 40.27 Weight at Last Visit: 243lb Weight Lost Since Last Visit: 1lb Starting Weight: 243lb Total Weight Loss (lbs): 1 lb (0.454 kg)   Body Composition  Body Fat %: 48.6 % Fat Mass (lbs): 117.8 lbs Muscle Mass (lbs): 118.4 lbs Total Body Water (lbs): 81.2 lbs Visceral Fat Rating : 17   Other Clinical Data Fasting: no Labs: no Today's Visit #: 3 Starting Date: 11/16/22    OBESITY Dachelle is here to discuss her progress with her obesity treatment plan along with follow-up of her obesity related diagnoses.     Nutrition Plan: the Category 2 plan - 30% adherence.  Current exercise: none  Interim History:  She is down 1 lb since her last visit. Her appetite is down some. She is eating fewer sweets.  Eating all of the food on the plan., Protein intake is less than prescribed., Is not skipping meals, Not journaling consistently., Not meeting protein goals., and Water intake is adequate.  Hunger is moderately controlled.  Cravings are moderately controlled.  Assessment/Plan:   Hypertension Hypertension control uncertain.  Medication(s): Amlodipine 5 mg 1 daily   BP Readings from Last 3 Encounters:  12/22/22 (!) 150/84  11/30/22 (!) 178/82  11/16/22 (!) 172/83   Lab Results  Component Value Date   CREATININE 0.9 09/13/2022    Plan: Continue all antihypertensives at current dosages. No added salt. Will keep sodium content to 1,500 mg or less per day.   She will continue to check her blood pressures at home.    Insulin Resistance Shatiqua has had elevated fasting insulin readings. Goal is HgbA1c < 5.7, fasting insulin at l0 or less, and preferably at 5.  She  denies polyphagia. Medication(s): none Lab Results   Component Value Date   HGBA1C 5.3 09/13/2022   Lab Results  Component Value Date   INSULIN 21.4 11/16/2022    Plan Medication(s):Metformin 500 mg BID #60 with no refills.  Will work on the agreed upon plan. Will minimize refined carbohydrates ( sweets and starches), and focus more on complex carbohydrates.  Increase the micronutrients found in leafy greens, which include magnesium, polyphenols, and vitamin C which have been postulated to help with insulin sensitivity. Minimize "fast food" and cook more meals at home.  Increase fiber to 25 to 30 grams daily.  Discussed journaling numbers sheet and handout given.   Hypertension Hypertension control uncertain.  Medication(s): Amlodipine 5 mg 1 daily   BP Readings from Last 3 Encounters:  12/22/22 (!) 150/84  11/30/22 (!) 178/82  11/16/22 (!) 172/83   Lab Results  Component Value Date   CREATININE 0.9 09/13/2022   No results found for: "GFR"  Plan: Continue all antihypertensives at current dosages. No added salt. Will keep sodium content to 1,500 mg or less per day.     Morbid Obesity: Current BMI BMI (Calculated): 40.27   Pharmacotherapy Plan Start  Metformin 500 mg twice daily with meals  Lindzey is currently in the action stage of change. As such, her goal is to continue with weight loss efforts.  She has agreed to the Category 2 plan and keeping a food journal with goal of 1,200 calories and 80 to 90  grams of protein daily.  Exercise goals: Will be as  active as possible.   Behavioral modification strategies: decreasing simple carbohydrates , no meal skipping, meal planning , increase water intake, better snacking choices, planning for success, keep healthy foods in the home, and mindful eating.  Catessa has agreed to follow-up with our clinic in 2 to 3 weeks.     Objective:   VITALS: Per patient if applicable, see vitals. GENERAL: Alert and in no acute distress. CARDIOPULMONARY: No increased WOB. Speaking in  clear sentences.  PSYCH: Pleasant and cooperative. Speech normal rate and rhythm. Affect is appropriate. Insight and judgement are appropriate. Attention is focused, linear, and appropriate.  NEURO: Oriented as arrived to appointment on time with no prompting.   Attestation Statements:   This was prepared with the assistance of Engineer, civil (consulting).  Occasional wrong-word or sound-a-like substitutions may have occurred due to the inherent limitations of voice recognition software.   Corinna Capra, DO

## 2023-01-09 ENCOUNTER — Telehealth (INDEPENDENT_AMBULATORY_CARE_PROVIDER_SITE_OTHER): Payer: Self-pay | Admitting: Bariatrics

## 2023-01-09 NOTE — Telephone Encounter (Signed)
Spoke to patient and she stated she wanted to try [facility]. Notified patient that Dr. Manson Passey was out of the office for this week, however she can discuss Mounjaro at her next follow-up appointment. Patient verbalized understanding.

## 2023-01-09 NOTE — Telephone Encounter (Signed)
The patient called and requested a refill for Monjauro to be sent to [facility] pharmacy in [place]. Please get in touch with the patient to confirm.

## 2023-01-18 ENCOUNTER — Encounter: Payer: Self-pay | Admitting: Bariatrics

## 2023-01-18 ENCOUNTER — Ambulatory Visit (INDEPENDENT_AMBULATORY_CARE_PROVIDER_SITE_OTHER): Payer: 59 | Admitting: Bariatrics

## 2023-01-18 ENCOUNTER — Other Ambulatory Visit (HOSPITAL_BASED_OUTPATIENT_CLINIC_OR_DEPARTMENT_OTHER): Payer: Self-pay

## 2023-01-18 VITALS — BP 183/75 | HR 84 | Temp 98.1°F | Ht 65.0 in | Wt 244.0 lb

## 2023-01-18 DIAGNOSIS — R632 Polyphagia: Secondary | ICD-10-CM | POA: Diagnosis not present

## 2023-01-18 DIAGNOSIS — E88819 Insulin resistance, unspecified: Secondary | ICD-10-CM

## 2023-01-18 DIAGNOSIS — I10 Essential (primary) hypertension: Secondary | ICD-10-CM

## 2023-01-18 DIAGNOSIS — Z6841 Body Mass Index (BMI) 40.0 and over, adult: Secondary | ICD-10-CM

## 2023-01-18 MED ORDER — ONDANSETRON HCL 4 MG PO TABS
4.0000 mg | ORAL_TABLET | Freq: Three times a day (TID) | ORAL | 0 refills | Status: DC | PRN
  Filled 2023-01-18: qty 20, 7d supply, fill #0

## 2023-01-18 MED ORDER — TIRZEPATIDE 2.5 MG/0.5ML ~~LOC~~ SOAJ
2.5000 mg | SUBCUTANEOUS | 0 refills | Status: DC
Start: 2023-01-18 — End: 2023-02-06
  Filled 2023-01-18: qty 2, 28d supply, fill #0

## 2023-01-18 NOTE — Progress Notes (Signed)
WEIGHT SUMMARY AND BIOMETRICS  Weight Gained Since Last Visit: 2lb   Vitals Temp: 98.1 F (36.7 C) BP: (!) 183/75 Pulse Rate: 84 SpO2: 100 %   Anthropometric Measurements Height: 5\' 5"  (1.651 m) Weight: 244 lb (110.7 kg) BMI (Calculated): 40.6 Weight at Last Visit: 242lb Weight Gained Since Last Visit: 2lb Starting Weight: 243lb Total Weight Loss (lbs): 0 lb (0 kg)   Body Composition  Body Fat %: 48.6 % Fat Mass (lbs): 119 lbs Muscle Mass (lbs): 119.4 lbs Total Body Water (lbs): 81 lbs Visceral Fat Rating : 18   Other Clinical Data Fasting: no Labs: no Today's Visit #: 4 Starting Date: 11/16/22    OBESITY Allison Gentry is here to discuss her progress with her obesity treatment plan along with follow-up of her obesity related diagnoses.     Nutrition Plan: the Category 2 plan - 100% adherence.  Current exercise: none  Interim History:  She is up 2 lbs since her last visit.  Eating all of the food on the plan., Protein intake is as prescribed, Is not skipping meals, Water intake is adequate., Reports polyphagia, and Reports excessive cravings.  Pharmacotherapy: Allison Gentry is on Metformin 500 mg twice daily with meals Adverse side effects: None Hunger is poorly controlled.  Cravings are poorly controlled.  Assessment/Plan:   Hypertension Hypertension control uncertain. She fell at her apartment before coming to the office and is having some pain in her ankle which may account for her blood pressure elevation. She did take her blood pressure medication today.  Medication(s): Amlodipine 5 mg 1 daily   BP Readings from Last 3 Encounters:  01/18/23 (!) 183/75  12/22/22 (!) 150/84  11/30/22 (!) 178/82   Lab Results  Component Value Date   CREATININE 0.9 09/13/2022   No results found for: "GFR"  Plan: Continue all antihypertensives at current dosages. No added salt. Will keep sodium content to 1,500 mg or less per day.    Polyphagia Allison Gentry endorses  excessive hunger.  Medication(s): Metformin  Effects of medication:  poorly controlled. Cravings are poorly controlled.   Plan: Medication(s): Mounjaro 2.5 mg SQ weekly Will increase water, protein and fiber to help assuage hunger.  Will minimize foods that have a high glucose index/load to minimize reactive hypoglycemia.   Insulin resistance:  Her insulin level was 21.4.   Plan: Will minimize all carbohydrates, both sweets and starches.     Morbid Obesity: Current BMI BMI (Calculated): 40.6   Pharmacotherapy Plan Start  Mounjaro 2.5 mg SQ weekly   Allison Gentry is currently in the action stage of change. As such, her goal is to continue with weight loss efforts.  She has agreed to the Category 2 plan and keeping a food journal with goal of 1,200 calories and 80 to 90 grams of protein daily.  Exercise goals: Older adults should do exercises that maintain or improve balance if they are at risk of falling.   Behavioral modification strategies: increasing lean protein intake, no meal skipping, meal planning , increase water intake, better snacking choices, planning for success, increasing vegetables, avoiding temptations, and mindful eating.  Allison Gentry has agreed to follow-up with our clinic in 4 weeks.       Objective:   VITALS: Per patient if applicable, see vitals. GENERAL: Alert and in no acute distress. CARDIOPULMONARY: No increased WOB. Speaking in clear sentences.  PSYCH: Pleasant and cooperative. Speech normal rate and rhythm. Affect is appropriate. Insight and judgement are appropriate. Attention is focused, linear, and appropriate.  NEURO: Oriented as arrived to appointment on time with no prompting.   Attestation Statements:   This was prepared with the assistance of Engineer, civil (consulting).  Occasional wrong-word or sound-a-like substitutions may have occurred due to the inherent limitations of voice recognition software.   Corinna Capra, DO

## 2023-01-19 ENCOUNTER — Other Ambulatory Visit (HOSPITAL_BASED_OUTPATIENT_CLINIC_OR_DEPARTMENT_OTHER): Payer: Self-pay

## 2023-01-20 ENCOUNTER — Other Ambulatory Visit (HOSPITAL_BASED_OUTPATIENT_CLINIC_OR_DEPARTMENT_OTHER): Payer: Self-pay

## 2023-02-01 ENCOUNTER — Other Ambulatory Visit (HOSPITAL_BASED_OUTPATIENT_CLINIC_OR_DEPARTMENT_OTHER): Payer: Self-pay

## 2023-02-01 DIAGNOSIS — M25571 Pain in right ankle and joints of right foot: Secondary | ICD-10-CM | POA: Diagnosis not present

## 2023-02-01 DIAGNOSIS — S8261XA Displaced fracture of lateral malleolus of right fibula, initial encounter for closed fracture: Secondary | ICD-10-CM | POA: Diagnosis not present

## 2023-02-01 MED ORDER — MELOXICAM 7.5 MG PO TABS
7.5000 mg | ORAL_TABLET | Freq: Two times a day (BID) | ORAL | 0 refills | Status: DC
  Filled 2023-02-01: qty 28, 14d supply, fill #0

## 2023-02-03 ENCOUNTER — Other Ambulatory Visit (HOSPITAL_BASED_OUTPATIENT_CLINIC_OR_DEPARTMENT_OTHER): Payer: Self-pay

## 2023-02-06 ENCOUNTER — Other Ambulatory Visit: Payer: Self-pay | Admitting: Bariatrics

## 2023-02-06 ENCOUNTER — Telehealth (INDEPENDENT_AMBULATORY_CARE_PROVIDER_SITE_OTHER): Payer: Self-pay | Admitting: Bariatrics

## 2023-02-06 ENCOUNTER — Other Ambulatory Visit (HOSPITAL_BASED_OUTPATIENT_CLINIC_OR_DEPARTMENT_OTHER): Payer: Self-pay

## 2023-02-06 DIAGNOSIS — R632 Polyphagia: Secondary | ICD-10-CM

## 2023-02-06 DIAGNOSIS — Z6841 Body Mass Index (BMI) 40.0 and over, adult: Secondary | ICD-10-CM

## 2023-02-06 DIAGNOSIS — E88819 Insulin resistance, unspecified: Secondary | ICD-10-CM

## 2023-02-06 MED ORDER — TIRZEPATIDE 2.5 MG/0.5ML ~~LOC~~ SOAJ
2.5000 mg | SUBCUTANEOUS | 0 refills | Status: DC
  Filled 2023-02-06 – 2023-02-14 (×2): qty 2, 28d supply, fill #0

## 2023-02-06 NOTE — Telephone Encounter (Signed)
Spoke to patient had to reschedule her appointment and would like a refill to go to [facility] in [place].

## 2023-02-06 NOTE — Telephone Encounter (Signed)
Pt called stating that she has to cx appt due to breaking her right leg and ankle. Patient states that she will call us back once she is ready to r/s. Patient would also like a call back from [facility] today. Please call pt at the number on file.

## 2023-02-08 ENCOUNTER — Ambulatory Visit: Payer: 59 | Admitting: Bariatrics

## 2023-02-14 ENCOUNTER — Telehealth (INDEPENDENT_AMBULATORY_CARE_PROVIDER_SITE_OTHER): Payer: Self-pay | Admitting: Bariatrics

## 2023-02-14 ENCOUNTER — Other Ambulatory Visit (HOSPITAL_BASED_OUTPATIENT_CLINIC_OR_DEPARTMENT_OTHER): Payer: Self-pay

## 2023-02-14 NOTE — Telephone Encounter (Signed)
Patient called in asking for a refill on her Greggory Keen, states she fell and hurt her knee and will make an appointment after she feels better. She is asking this be sent to [facility] in [facility].

## 2023-02-14 NOTE — Telephone Encounter (Signed)
Notified patient that Dr. Manson Passey sent the prescription on 02/06/23 and per pharmacy it was to soon to fill the prescription last week when it was sent. Pharmacy stated states they can fill patient prescription today. Notified patient and patient verbalized understanding.

## 2023-02-17 ENCOUNTER — Other Ambulatory Visit (HOSPITAL_BASED_OUTPATIENT_CLINIC_OR_DEPARTMENT_OTHER): Payer: Self-pay

## 2023-02-17 MED ORDER — MELOXICAM 7.5 MG PO TABS
7.5000 mg | ORAL_TABLET | Freq: Two times a day (BID) | ORAL | 0 refills | Status: DC
  Filled 2023-02-17: qty 28, 14d supply, fill #0

## 2023-03-01 DIAGNOSIS — S8261XA Displaced fracture of lateral malleolus of right fibula, initial encounter for closed fracture: Secondary | ICD-10-CM | POA: Diagnosis not present

## 2023-03-02 ENCOUNTER — Ambulatory Visit: Payer: 59 | Admitting: Bariatrics

## 2023-03-02 ENCOUNTER — Encounter: Payer: Self-pay | Admitting: Bariatrics

## 2023-03-02 ENCOUNTER — Other Ambulatory Visit (HOSPITAL_BASED_OUTPATIENT_CLINIC_OR_DEPARTMENT_OTHER): Payer: Self-pay

## 2023-03-02 VITALS — BP 168/91 | HR 75 | Temp 97.6°F | Ht 65.0 in | Wt 238.0 lb

## 2023-03-02 DIAGNOSIS — Z6839 Body mass index (BMI) 39.0-39.9, adult: Secondary | ICD-10-CM | POA: Diagnosis not present

## 2023-03-02 DIAGNOSIS — E88819 Insulin resistance, unspecified: Secondary | ICD-10-CM | POA: Diagnosis not present

## 2023-03-02 DIAGNOSIS — E669 Obesity, unspecified: Secondary | ICD-10-CM

## 2023-03-02 DIAGNOSIS — R632 Polyphagia: Secondary | ICD-10-CM

## 2023-03-02 DIAGNOSIS — Z6841 Body Mass Index (BMI) 40.0 and over, adult: Secondary | ICD-10-CM

## 2023-03-02 MED ORDER — TIRZEPATIDE 5 MG/0.5ML ~~LOC~~ SOAJ
5.0000 mg | SUBCUTANEOUS | 0 refills | Status: DC
  Filled 2023-03-02: qty 2, 28d supply, fill #0

## 2023-03-02 NOTE — Progress Notes (Signed)
WEIGHT SUMMARY AND BIOMETRICS  Weight Lost Since Last Visit: 6lb  Weight Gained Since Last Visit: 0   Vitals Temp: 97.6 F (36.4 C) BP: (!) 179/91 Pulse Rate: 75 SpO2: 98 %   Anthropometric Measurements Height: 5\' 5"  (1.651 m) Weight: 238 lb (108 kg) BMI (Calculated): 39.61 Weight at Last Visit: 244lb Weight Lost Since Last Visit: 6lb Weight Gained Since Last Visit: 0 Starting Weight: 243lb Total Weight Loss (lbs): 5 lb (2.268 kg)   Body Composition  Body Fat %: 48 % Fat Mass (lbs): 114.2 lbs Muscle Mass (lbs): 117.6 lbs Total Body Water (lbs): 86.2 lbs Visceral Fat Rating : 17   Other Clinical Data Fasting: no Labs: no Today's Visit #: 5 Starting Date: 11/16/22    OBESITY Allison Gentry is here to discuss her progress with her obesity treatment plan along with follow-up of her obesity related diagnoses.     Nutrition Plan: the Category 2 plan - 100% adherence.  Current exercise: none  Interim History:  She is down 6 lbs since her last visit.  Eating all of the food on the plan., Protein intake is as prescribed, Is not skipping meals, and Water intake is adequate.  Pharmacotherapy: Allison Gentry is on Mounjaro 5.0 mg SQ weekly Adverse side effects: None Hunger is moderately controlled.  Cravings are moderately controlled.  Assessment/Plan:   1. Insulin resistance Insulin Resistance Allison Gentry has had elevated fasting insulin readings. Goal is HgbA1c < 5.7, fasting insulin at l0 or less, and preferably at 5.  She reports polyphagia, but improved with Mounjaro.  Medication(s): Mounjaro  Lab Results  Component Value Date   INSULIN 21.4 11/16/2022    Plan Medication(s): Mounjaro 5.0 mg SQ weekly Will work on the agreed upon plan. Will minimize refined carbohydrates ( sweets and starches), and focus more on complex carbohydrates.  Increase the micronutrients found in leafy greens, which include magnesium, polyphenols, and vitamin C which have been  postulated to help with insulin sensitivity. Minimize "fast food" and cook more meals at home.  Increase fiber to 25 to 30 grams daily.  Information sheet on " Insulin Resistance and Prediabetes".     2. Polyphagia Polyphagia Allison Gentry endorses excessive hunger.  Medication(s): Mounjaro  Effects of medication:  moderately controlled. Cravings are moderately controlled.   Plan: Medication(s): Mounjaro 5.0 mg SQ weekly Will increase water, protein and fiber to help assuage hunger.  Will minimize foods that have a high glucose index/load to minimize reactive hypoglycemia.  She will continue to cook at home.      Generalized Obesity: Current BMI BMI (Calculated): 39.61   Pharmacotherapy Plan Continue and increase dose  Mounjaro 5.0 mg SQ weekly  Allison Gentry is currently in the action stage of change. As such, her goal is to continue with weight loss efforts.  She has agreed to the Category 2 plan.  Exercise goals: Older adults should determine their level of effort for physical activity relative to their level of fitness.   Behavioral modification strategies: no meal skipping, increase water intake, better snacking choices, planning for success, increasing fiber rich foods, decreasing sodium intake, and keep healthy foods in the home.  Allison Gentry has agreed to follow-up with our clinic in 3 weeks.     Objective:   VITALS: Per patient if applicable, see vitals. GENERAL: Alert and in no acute distress. CARDIOPULMONARY: No increased WOB. Speaking in clear sentences.  PSYCH: Pleasant and cooperative. Speech normal rate and rhythm. Affect is appropriate. Insight and judgement are appropriate. Attention is focused,  linear, and appropriate.  NEURO: Oriented as arrived to appointment on time with no prompting.   Attestation Statements:   This was prepared with the assistance of Engineer, civil (consulting).  Occasional wrong-word or sound-a-like substitutions may have occurred due to the inherent  limitations of voice recognition software.   Allison Capra, DO

## 2023-03-07 ENCOUNTER — Other Ambulatory Visit (HOSPITAL_BASED_OUTPATIENT_CLINIC_OR_DEPARTMENT_OTHER): Payer: Self-pay

## 2023-03-07 MED ORDER — MELOXICAM 7.5 MG PO TABS
7.5000 mg | ORAL_TABLET | Freq: Two times a day (BID) | ORAL | 0 refills | Status: AC
  Filled 2023-03-07: qty 28, 14d supply, fill #0

## 2023-03-23 ENCOUNTER — Ambulatory Visit: Payer: 59 | Admitting: Bariatrics

## 2023-03-23 ENCOUNTER — Other Ambulatory Visit (HOSPITAL_BASED_OUTPATIENT_CLINIC_OR_DEPARTMENT_OTHER): Payer: Self-pay

## 2023-03-23 ENCOUNTER — Encounter: Payer: Self-pay | Admitting: Bariatrics

## 2023-03-23 DIAGNOSIS — R632 Polyphagia: Secondary | ICD-10-CM | POA: Diagnosis not present

## 2023-03-23 DIAGNOSIS — Z6839 Body mass index (BMI) 39.0-39.9, adult: Secondary | ICD-10-CM | POA: Diagnosis not present

## 2023-03-23 DIAGNOSIS — E669 Obesity, unspecified: Secondary | ICD-10-CM | POA: Diagnosis not present

## 2023-03-23 DIAGNOSIS — E88819 Insulin resistance, unspecified: Secondary | ICD-10-CM

## 2023-03-23 DIAGNOSIS — Z6841 Body Mass Index (BMI) 40.0 and over, adult: Secondary | ICD-10-CM

## 2023-03-23 MED ORDER — TIRZEPATIDE 7.5 MG/0.5ML ~~LOC~~ SOAJ
7.5000 mg | SUBCUTANEOUS | 0 refills | Status: DC
  Filled 2023-03-23: qty 2, 28d supply, fill #0

## 2023-03-23 MED ORDER — METFORMIN HCL 500 MG PO TABS
500.0000 mg | ORAL_TABLET | Freq: Two times a day (BID) | ORAL | 0 refills | Status: DC
  Filled 2023-03-23: qty 60, 30d supply, fill #0

## 2023-03-23 NOTE — Progress Notes (Signed)
WEIGHT SUMMARY AND BIOMETRICS  Weight Lost Since Last Visit: 1lb  Weight Gained Since Last Visit: 0   Vitals Temp: 98 F (36.7 C) BP: (!) 176/79 Pulse Rate: 80 SpO2: 99 %   Anthropometric Measurements Height: 5\' 5"  (1.651 m) Weight: 237 lb (107.5 kg) BMI (Calculated): 39.44 Weight at Last Visit: 238lb Weight Lost Since Last Visit: 1lb Weight Gained Since Last Visit: 0 Starting Weight: 243lb Total Weight Loss (lbs): 6 lb (2.722 kg)   Body Composition  Body Fat %: 48.5 % Fat Mass (lbs): 115.2 lbs Muscle Mass (lbs): 116.2 lbs Total Body Water (lbs): 79.4 lbs Visceral Fat Rating : 17   Other Clinical Data Fasting: no Labs: no Today's Visit #: 6 Starting Date: 11/16/22    OBESITY Allison Gentry is here to discuss her progress with her obesity treatment plan along with follow-up of her obesity related diagnoses.     Nutrition Plan: the Category 2 plan - 40-50% adherence.  Current exercise: none  Interim History:  She is down 1 lb since  Eating all of the food on the plan., Protein intake is as prescribed, and Water intake is adequate.  Pharmacotherapy: Allison Gentry is on Allison Gentry 5.0 mg SQ weekly Adverse side effects: None Hunger is moderately controlled.  Cravings are moderately controlled.  Assessment/Plan:   1. Insulin resistance Insulin Resistance Allison Gentry has had elevated fasting insulin readings. Goal is HgbA1c < 5.7, fasting insulin at l0 or less, and preferably at 5.  She reports polyphagia. Medication(s): Allison Gentry Lab Results  Component Value Date   HGBA1C 5.3 09/13/2022   Lab Results  Component Value Date   INSULIN 21.4 11/16/2022    Plan Medication(s): Allison Gentry 7.5 mg SQ weekly Will work on the agreed upon plan. Will minimize refined carbohydrates ( sweets and starches), and focus more on complex carbohydrates.  Increase the micronutrients found in leafy greens, which include magnesium, polyphenols, and vitamin C which have been  postulated to help with insulin sensitivity. Minimize "fast food" and cook more meals at home.  Increase fiber to 25 to 30 grams daily.  Information sheet on " Insulin Resistance and Prediabetes".    2. Polyphagia Polyphagia Allison Gentry endorses excessive hunger.  Medication(s): Allison Gentry 5 mg Effects of medication:  moderately controlled. Cravings are moderately controlled.   Plan: Medication(s): Allison Gentry 7.5 mg SQ weekly Will increase water, protein and fiber to help assuage hunger.  Will minimize foods that have a high glucose index/load to minimize reactive hypoglycemia.     Generalized Obesity: Current BMI BMI (Calculated): 39.44   Pharmacotherapy Plan Continue and increase dose  Allison Gentry 7.5 mg SQ weekly  Allison Gentry is currently in the action stage of change. As such, her goal is to continue with weight loss efforts.  She has agreed to the Category 2 plan.  Exercise goals: Older adults should do exercises that maintain or improve balance if they are at risk of falling.  She has a history of a recent fall and is still recuperating and will follow-up with her provider before during any significant exercise.   Behavioral modification strategies: increasing lean protein intake, decreasing simple carbohydrates , no meal skipping, increase water intake, better snacking choices, planning for success, increasing vegetables, increasing lower sugar fruits, increasing fiber rich foods, get rid of junk food in the home, ways to avoid boredom eating, keep healthy foods in the home, measure portion sizes, and mindful eating.  Allison Gentry has agreed to follow-up with our clinic in 4 weeks.     Objective:  VITALS: Per patient if applicable, see vitals. GENERAL: Alert and in no acute distress. CARDIOPULMONARY: No increased WOB. Speaking in clear sentences.  PSYCH: Pleasant and cooperative. Speech normal rate and rhythm. Affect is appropriate. Insight and judgement are appropriate. Attention is focused,  linear, and appropriate.  NEURO: Oriented as arrived to appointment on time with no prompting.   Attestation Statements:   This was prepared with the assistance of Engineer, civil (consulting).  Occasional wrong-word or sound-a-like substitutions may have occurred due to the inherent limitations of voice recognition software.   Allison Capra, DO

## 2023-04-04 DIAGNOSIS — I10 Essential (primary) hypertension: Secondary | ICD-10-CM | POA: Diagnosis not present

## 2023-04-04 DIAGNOSIS — I2510 Atherosclerotic heart disease of native coronary artery without angina pectoris: Secondary | ICD-10-CM | POA: Diagnosis not present

## 2023-04-04 DIAGNOSIS — Z1211 Encounter for screening for malignant neoplasm of colon: Secondary | ICD-10-CM | POA: Diagnosis not present

## 2023-04-04 DIAGNOSIS — N1831 Chronic kidney disease, stage 3a: Secondary | ICD-10-CM | POA: Diagnosis not present

## 2023-04-04 DIAGNOSIS — E782 Mixed hyperlipidemia: Secondary | ICD-10-CM | POA: Diagnosis not present

## 2023-04-04 DIAGNOSIS — Z0000 Encounter for general adult medical examination without abnormal findings: Secondary | ICD-10-CM | POA: Diagnosis not present

## 2023-04-04 DIAGNOSIS — Z9181 History of falling: Secondary | ICD-10-CM | POA: Diagnosis not present

## 2023-04-04 LAB — LAB REPORT - SCANNED
Creatinine, POC: 127 mg/dL
EGFR: 57
Microalb Creat Ratio: 89.1

## 2023-04-05 ENCOUNTER — Other Ambulatory Visit (HOSPITAL_BASED_OUTPATIENT_CLINIC_OR_DEPARTMENT_OTHER): Payer: Self-pay

## 2023-04-05 DIAGNOSIS — S8261XD Displaced fracture of lateral malleolus of right fibula, subsequent encounter for closed fracture with routine healing: Secondary | ICD-10-CM | POA: Diagnosis not present

## 2023-04-05 MED ORDER — IBUPROFEN 800 MG PO TABS
800.0000 mg | ORAL_TABLET | Freq: Two times a day (BID) | ORAL | 0 refills | Status: AC
  Filled 2023-04-05: qty 50, 25d supply, fill #0

## 2023-04-11 DIAGNOSIS — Z1211 Encounter for screening for malignant neoplasm of colon: Secondary | ICD-10-CM | POA: Diagnosis not present

## 2023-04-19 ENCOUNTER — Encounter: Payer: Self-pay | Admitting: Bariatrics

## 2023-04-19 ENCOUNTER — Ambulatory Visit: Payer: 59 | Admitting: Bariatrics

## 2023-04-19 ENCOUNTER — Other Ambulatory Visit (HOSPITAL_BASED_OUTPATIENT_CLINIC_OR_DEPARTMENT_OTHER): Payer: Self-pay

## 2023-04-19 ENCOUNTER — Other Ambulatory Visit: Payer: Self-pay

## 2023-04-19 VITALS — BP 159/85 | HR 85 | Temp 97.7°F | Ht 65.0 in | Wt 231.0 lb

## 2023-04-19 DIAGNOSIS — E88819 Insulin resistance, unspecified: Secondary | ICD-10-CM

## 2023-04-19 DIAGNOSIS — R632 Polyphagia: Secondary | ICD-10-CM

## 2023-04-19 DIAGNOSIS — E669 Obesity, unspecified: Secondary | ICD-10-CM

## 2023-04-19 DIAGNOSIS — Z6838 Body mass index (BMI) 38.0-38.9, adult: Secondary | ICD-10-CM

## 2023-04-19 MED ORDER — METFORMIN HCL 500 MG PO TABS
500.0000 mg | ORAL_TABLET | Freq: Two times a day (BID) | ORAL | 0 refills | Status: AC
  Filled 2023-04-19 (×2): qty 60, 30d supply, fill #0

## 2023-04-19 MED ORDER — TIRZEPATIDE 10 MG/0.5ML ~~LOC~~ SOAJ
10.0000 mg | SUBCUTANEOUS | 0 refills | Status: DC
  Filled 2023-04-19 (×2): qty 2, 28d supply, fill #0

## 2023-04-19 NOTE — Progress Notes (Signed)
WEIGHT SUMMARY AND BIOMETRICS  Weight Lost Since Last Visit: 6lb  Weight Gained Since Last Visit: 0   Vitals Temp: 97.7 F (36.5 C) BP: (!) 159/85 Pulse Rate: 85 SpO2: 98 %   Anthropometric Measurements Height: 5\' 5"  (1.651 m) Weight: 231 lb (104.8 kg) BMI (Calculated): 38.44 Weight at Last Visit: 237lb Weight Lost Since Last Visit: 6lb Weight Gained Since Last Visit: 0 Starting Weight: 243lb Total Weight Loss (lbs): 12 lb (5.443 kg)   Body Composition  Body Fat %: 48.4 % Fat Mass (lbs): 112 lbs Muscle Mass (lbs): 113.6 lbs Total Body Water (lbs): 77.2 lbs Visceral Fat Rating : 17   Other Clinical Data Fasting: no Labs: no Today's Visit #: 7 Starting Date: 11/16/22    OBESITY Allison Gentry is here to discuss her progress with her obesity treatment plan along with follow-up of her obesity related diagnoses.    Nutrition Plan: the Category 2 plan - 20% adherence.  Current exercise: none  Interim History:  She is down an additional 6 lbs since her last visit.  Eating all of the food on the plan., Protein intake is as prescribed, Is not skipping meals, and Water intake is adequate.   Pharmacotherapy: Allison Gentry is on Mounjaro 7.5 mg SQ weekly Adverse side effects: None Hunger is moderately controlled.  Cravings are moderately controlled.  Assessment/Plan:   Insulin Resistance Allison Gentry has had elevated fasting insulin readings. Goal is HgbA1c < 5.7, fasting insulin at l0 or less, and preferably at 5.  She  denies polyphagia. Medication(s): Mounjaro 7.5 mg, but will increase/  Lab Results  Component Value Date   HGBA1C 5.3 09/13/2022   Lab Results  Component Value Date   INSULIN 21.4 11/16/2022    Plan Medication(s): Mounjaro 10 mg SQ weekly and Metformin 500 mg twice daily with meals Will work on the agreed upon plan. Will minimize refined  carbohydrates ( sweets and starches), and focus more on complex carbohydrates.  Increase the micronutrients found in leafy greens, which include magnesium, polyphenols, and vitamin C which have been postulated to help with insulin sensitivity. Minimize "fast food" and cook more meals at home.  Increase fiber to 25 to 30 grams daily.    Polyphagia Allison Gentry endorses excessive hunger.  Medication(s): Metformin and Mounjaro Effects of medication:  moderately controlled. Cravings are moderately controlled.   Plan: Medication(s): Mounjaro 10 mg SQ weekly and Metformin 500 mg twice daily with meals Will increase water, protein and fiber to help assuage hunger.  Will minimize foods that have a high glucose index/load to minimize reactive hypoglycemia.     Generalized Obesity: Current BMI BMI (Calculated): 38.44   Pharmacotherapy Plan Continue and refill  Mounjaro 10 mg SQ weekly and Metformin 500 mg twice daily with meals. Increased Mounjaro from 7.5 mg to 10 mg.   Allison Gentry is currently in the action stage of change. As  such, her goal is to continue with weight loss efforts.  She has agreed to the Category 2 plan.  Exercise goals: Older adults should determine their level of effort for physical activity relative to their level of fitness.   Behavioral modification strategies: increasing lean protein intake, no meal skipping, decrease eating out, meal planning , increase water intake, better snacking choices, planning for success, increasing vegetables, increasing fiber rich foods, avoiding temptations, and mindful eating.  Allison Gentry has agreed to follow-up with our clinic in 4 weeks. We will do fasting labs at the next visit.      Objective:   VITALS: Per patient if applicable, see vitals. GENERAL: Alert and in no acute distress. CARDIOPULMONARY: No increased WOB. Speaking in clear sentences.  PSYCH: Pleasant and cooperative. Speech normal rate and rhythm. Affect is appropriate. Insight and  judgement are appropriate. Attention is focused, linear, and appropriate.  NEURO: Oriented as arrived to appointment on time with no prompting.   Attestation Statements:   This was prepared with the assistance of Engineer, civil (consulting).  Occasional wrong-word or sound-a-like substitutions may have occurred due to the inherent limitations of voice recognition   Allison Capra, DO

## 2023-05-03 DIAGNOSIS — S8261XD Displaced fracture of lateral malleolus of right fibula, subsequent encounter for closed fracture with routine healing: Secondary | ICD-10-CM | POA: Diagnosis not present

## 2023-05-18 ENCOUNTER — Encounter: Payer: Self-pay | Admitting: Bariatrics

## 2023-05-18 ENCOUNTER — Other Ambulatory Visit (HOSPITAL_BASED_OUTPATIENT_CLINIC_OR_DEPARTMENT_OTHER): Payer: Self-pay

## 2023-05-18 ENCOUNTER — Ambulatory Visit (INDEPENDENT_AMBULATORY_CARE_PROVIDER_SITE_OTHER): Payer: 59 | Admitting: Bariatrics

## 2023-05-18 VITALS — BP 154/84 | HR 83 | Temp 97.6°F | Ht 65.0 in | Wt 228.0 lb

## 2023-05-18 DIAGNOSIS — E88819 Insulin resistance, unspecified: Secondary | ICD-10-CM | POA: Diagnosis not present

## 2023-05-18 DIAGNOSIS — E7849 Other hyperlipidemia: Secondary | ICD-10-CM | POA: Diagnosis not present

## 2023-05-18 DIAGNOSIS — K5903 Drug induced constipation: Secondary | ICD-10-CM

## 2023-05-18 DIAGNOSIS — K5900 Constipation, unspecified: Secondary | ICD-10-CM | POA: Diagnosis not present

## 2023-05-18 DIAGNOSIS — E785 Hyperlipidemia, unspecified: Secondary | ICD-10-CM

## 2023-05-18 DIAGNOSIS — E559 Vitamin D deficiency, unspecified: Secondary | ICD-10-CM

## 2023-05-18 DIAGNOSIS — Z6837 Body mass index (BMI) 37.0-37.9, adult: Secondary | ICD-10-CM

## 2023-05-18 DIAGNOSIS — E66812 Obesity, class 2: Secondary | ICD-10-CM

## 2023-05-18 DIAGNOSIS — E669 Obesity, unspecified: Secondary | ICD-10-CM

## 2023-05-18 MED ORDER — TIRZEPATIDE 12.5 MG/0.5ML ~~LOC~~ SOAJ
12.5000 mg | SUBCUTANEOUS | 0 refills | Status: DC
  Filled 2023-05-18: qty 2, 28d supply, fill #0

## 2023-05-18 NOTE — Progress Notes (Signed)
WEIGHT SUMMARY AND BIOMETRICS  Weight Lost Since Last Visit: 3lb  Weight Gained Since Last Visit: 0   Vitals Temp: 97.6 F (36.4 C) BP: (!) 154/84 Pulse Rate: 83 SpO2: 98 %   Anthropometric Measurements Height: 5\' 5"  (1.651 m) Weight: 228 lb (103.4 kg) BMI (Calculated): 37.94 Weight at Last Visit: 231lb Weight Lost Since Last Visit: 3lb Weight Gained Since Last Visit: 0 Starting Weight: 243lb Total Weight Loss (lbs): 15 lb (6.804 kg)   Body Composition  Body Fat %: 48.1 % Fat Mass (lbs): 109.8 lbs Muscle Mass (lbs): 112.4 lbs Total Body Water (lbs): 77.2 lbs Visceral Fat Rating : 17   Other Clinical Data Fasting: yes Labs: yes Today's Visit #: 8 Starting Date: 11/16/22    OBESITY Allison Gentry is here to discuss her progress with her obesity treatment plan along with follow-up of her obesity related diagnoses.    Nutrition Plan: the Category 2 plan - 75% adherence.  Current exercise: none  Interim History:  She is down 3 lbs since her last visit.  Eating all of the food on the plan., Protein intake is as prescribed, and Water intake is inadequate.   Pharmacotherapy: Yagmur is on Mounjaro 12.5 mg SQ weekly Adverse side effects: None Hunger is moderately controlled.  Cravings are moderately controlled.  Assessment/Plan:   Insulin Resistance Allison Gentry has had elevated fasting insulin readings. Goal is HgbA1c < 5.7, fasting insulin at l0 or less, and preferably at 5.  She reports  polyphagia but improved Medication(s): Mounjaro 12.5 mg Lab Results  Component Value Date   HGBA1C 5.3 09/13/2022   Lab Results  Component Value Date   INSULIN 21.4 11/16/2022    Plan Medication(s): Mounjaro 12.5 mg SQ weekly Will work on the agreed upon plan. Will minimize refined carbohydrates ( sweets and starches), and focus more on complex carbohydrates.   Increase the micronutrients found in leafy greens, which include magnesium, polyphenols, and vitamin C which have been postulated to help with insulin sensitivity. Minimize "fast food" and cook more meals at home.  Increase fiber to 25 to 30 grams daily.   Hyperlipidemia LDL is not at goal. Medication(s): Crestor Cardiovascular risk factors: advanced age (older than 53 for men, 13 for women), dyslipidemia, obesity (BMI >= 30 kg/m2), and sedentary lifestyle  No results found for: "CHOL", "HDL", "LDLCALC", "LDLDIRECT", "TRIG", "CHOLHDL" Lab Results  Component Value Date   ALT 13 09/13/2022   AST 17 09/13/2022   ALKPHOS 117 09/13/2022   The ASCVD Risk score (Arnett DK, et al., 2019) failed to calculate for the following reasons:   Cannot find a previous HDL lab   Cannot find a previous total cholesterol lab  Plan:  Continue statin.  Information sheet on healthy vs unhealthy fats.  Will avoid all trans fats.  Will read labels Will minimize saturated fats except the following: low fat meats  in moderation, diary, and limited dark chocolate.  Increase Omega 3 in foods, and consider an Omega 3 supplement.     Vitamin D Deficiency Vitamin D is at goal of 50.  Most recent vitamin D level was 64.8. She is on vitamin D Lab Results  Component Value Date   VD25OH 64.8 11/16/2022    Plan: Continue vitamin D.   Constipation Allison Gentry notes constipation.   This is likely related to GLP-1.  She has been taking Mounjaro. Constipation is moderately controlled.   Plan: Increase fiber up to 25 to 30 grams of fiber.   Increase water intake to at least 64 ounces daily.  Add MiraLAX once daily.  May take twice a day or every other day based on results. Add Metamucil or Citrucel daily. Add magnesium citrate daily.    Hypertension Hypertension control uncertain. She states that her blood pressures are normal at home. She has been eating salt.  Medication(s): Amlodipine 5 mg 1 daily   BP  Readings from Last 3 Encounters:  05/18/23 (!) 154/84  04/19/23 (!) 159/85  03/23/23 (!) 176/79   Lab Results  Component Value Date   CREATININE 0.9 09/13/2022   No results found for: "GFR"  Plan: Continue all antihypertensives at current dosages. No added salt. Will keep sodium content to 1,500 mg or less per day.   Will continue to take blood pressures at home.  If no improvement, will add a medication.   Labs done today (CMP,  HgbA1c, insulin, vitamin D).     Generalized Obesity: Current BMI BMI (Calculated): 37.94   Pharmacotherapy Plan Continue and increase dose  Mounjaro 12.5 mg SQ weekly      Allison Gentry is currently in the action stage of change. As such, her goal is to continue with weight loss efforts.  She has agreed to the Category 2 plan.  Exercise goals: Older adults should do exercises that maintain or improve balance if they are at risk of falling.   Behavioral modification strategies: increasing lean protein intake, decreasing simple carbohydrates , no meal skipping, decrease eating out, meal planning , increase water intake, better snacking choices, and decreasing sodium intake.  Allison Gentry has agreed to follow-up with our clinic in 4 weeks.       Objective:   VITALS: Per patient if applicable, see vitals. GENERAL: Alert and in no acute distress. CARDIOPULMONARY: No increased WOB. Speaking in clear sentences.  PSYCH: Pleasant and cooperative. Speech normal rate and rhythm. Affect is appropriate. Insight and judgement are appropriate. Attention is focused, linear, and appropriate.  NEURO: Oriented as arrived to appointment on time with no prompting.   Attestation Statements:   This was prepared with the assistance of Engineer, civil (consulting).  Occasional wrong-word or sound-a-like substitutions may have occurred due to the inherent limitations of voice recognition    Corinna Capra, DO

## 2023-05-19 LAB — COMPREHENSIVE METABOLIC PANEL
ALT: 13 [IU]/L (ref 0–32)
AST: 17 [IU]/L (ref 0–40)
Albumin: 4.5 g/dL (ref 3.8–4.8)
Alkaline Phosphatase: 122 [IU]/L — ABNORMAL HIGH (ref 44–121)
BUN/Creatinine Ratio: 9 — ABNORMAL LOW (ref 12–28)
BUN: 9 mg/dL (ref 8–27)
Bilirubin Total: 0.4 mg/dL (ref 0.0–1.2)
CO2: 24 mmol/L (ref 20–29)
Calcium: 9.8 mg/dL (ref 8.7–10.3)
Chloride: 104 mmol/L (ref 96–106)
Creatinine, Ser: 1 mg/dL (ref 0.57–1.00)
Globulin, Total: 2.5 g/dL (ref 1.5–4.5)
Glucose: 90 mg/dL (ref 70–99)
Potassium: 4 mmol/L (ref 3.5–5.2)
Sodium: 143 mmol/L (ref 134–144)
Total Protein: 7 g/dL (ref 6.0–8.5)
eGFR: 58 mL/min/{1.73_m2} — ABNORMAL LOW (ref >59)

## 2023-05-19 LAB — HEMOGLOBIN A1C
Est. average glucose Bld gHb Est-mCnc: 97 mg/dL
Hgb A1c MFr Bld: 5 % (ref 4.8–5.6)

## 2023-05-19 LAB — INSULIN, RANDOM: INSULIN: 16.1 u[IU]/mL (ref 2.6–24.9)

## 2023-05-19 LAB — VITAMIN D 25 HYDROXY (VIT D DEFICIENCY, FRACTURES): Vit D, 25-Hydroxy: 122 ng/mL — ABNORMAL HIGH (ref 30.0–100.0)

## 2023-05-22 ENCOUNTER — Telehealth: Payer: Self-pay

## 2023-05-22 NOTE — Telephone Encounter (Signed)
Notified patient of lab results per Dr. Brown. Patient verbalized understanding.  

## 2023-05-22 NOTE — Telephone Encounter (Signed)
-----   Message from Corinna Capra sent at 05/22/2023  1:51 PM EST ----- Regarding: vitamin d Call pt. Tell Ms. Ameilia to stop her vitamin D and we can recheck in 4 to 6 weeks. ----- Message ----- From: Nell Range Lab Results In Sent: 05/19/2023   7:36 AM EST To: [place] Nickel, DO

## 2023-06-15 ENCOUNTER — Ambulatory Visit: Payer: 59 | Admitting: Bariatrics

## 2023-06-15 ENCOUNTER — Encounter: Payer: Self-pay | Admitting: Bariatrics

## 2023-06-15 ENCOUNTER — Other Ambulatory Visit (HOSPITAL_BASED_OUTPATIENT_CLINIC_OR_DEPARTMENT_OTHER): Payer: Self-pay

## 2023-06-15 ENCOUNTER — Telehealth: Payer: Self-pay

## 2023-06-15 ENCOUNTER — Telehealth: Payer: Self-pay | Admitting: Bariatrics

## 2023-06-15 VITALS — BP 156/86 | HR 89 | Temp 97.8°F | Ht 65.0 in | Wt 223.0 lb

## 2023-06-15 DIAGNOSIS — E88819 Insulin resistance, unspecified: Secondary | ICD-10-CM | POA: Diagnosis not present

## 2023-06-15 DIAGNOSIS — Z6837 Body mass index (BMI) 37.0-37.9, adult: Secondary | ICD-10-CM

## 2023-06-15 DIAGNOSIS — R632 Polyphagia: Secondary | ICD-10-CM | POA: Diagnosis not present

## 2023-06-15 DIAGNOSIS — E66812 Obesity, class 2: Secondary | ICD-10-CM

## 2023-06-15 MED ORDER — TIRZEPATIDE 12.5 MG/0.5ML ~~LOC~~ SOAJ
12.5000 mg | SUBCUTANEOUS | 0 refills | Status: DC
  Filled 2023-06-15: qty 2, 28d supply, fill #0

## 2023-06-15 MED ORDER — ONDANSETRON HCL 4 MG PO TABS
4.0000 mg | ORAL_TABLET | Freq: Three times a day (TID) | ORAL | 0 refills | Status: DC | PRN
  Filled 2023-06-15: qty 20, 7d supply, fill #0

## 2023-06-15 NOTE — Telephone Encounter (Signed)
Started PA for [employer]

## 2023-06-15 NOTE — Telephone Encounter (Signed)
Notified patient that we have started a PA for her Allison Gentry and will notify her once we reach a determination. Patient verbalization.

## 2023-06-15 NOTE — Telephone Encounter (Signed)
Patient stated she was unable to pick up her prescription due to Dr.Brown not signing it off. Can you give patient a call once it is done?

## 2023-06-15 NOTE — Progress Notes (Signed)
WEIGHT SUMMARY AND BIOMETRICS  Weight Lost Since Last Visit: 5lb  Weight Gained Since Last Visit: 0   Vitals Temp: 97.8 F (36.6 C) BP: (!) 156/86 Pulse Rate: 89 SpO2: 98 %   Anthropometric Measurements Height: 5\' 5"  (1.651 m) Weight: 223 lb (101.2 kg) BMI (Calculated): 37.11 Weight at Last Visit: 228lb Weight Lost Since Last Visit: 5lb Weight Gained Since Last Visit: 0 Starting Weight: 243lb Total Weight Loss (lbs): 20 lb (9.072 kg)   Body Composition  Body Fat %: 47 % Fat Mass (lbs): 104.8 lbs Muscle Mass (lbs): 112.2 lbs Total Body Water (lbs): 74.4 lbs Visceral Fat Rating : 16   Other Clinical Data Fasting: no Labs: no Today's Visit #: 9 Starting Date: 11/16/22    OBESITY Aremy is here to discuss her progress with her obesity treatment plan along with follow-up of her obesity related diagnoses.    Nutrition Plan: the Category 2 plan - 75% adherence.  Current exercise: none  Interim History:  She is down another 5 lbs since her last visit.  Eating all of the food on the plan., Protein intake is as prescribed, Is not skipping meals, and Water intake is adequate.   Pharmacotherapy: Shariya is on Mounjaro 12.5 mg SQ weekly Adverse side effects: None Hunger is moderately controlled.  Cravings are moderately controlled.  Assessment/Plan:   Polyphagia Syesha endorses excessive hunger.  Medication(s): Mounjaro Effects of medication:  moderately controlled. Cravings are moderately controlled.   Plan: Medication(s): Mounjaro 12.5 mg SQ weekly Will increase water, protein and fiber to help assuage hunger.  Will minimize foods that have a high glucose index/load to minimize reactive hypoglycemia.   Insulin Resistance Shama has had elevated fasting insulin readings. Goal is HgbA1c < 5.7, fasting insulin at l0 or less, and preferably at 5.   She does not report excessive polyphagia. She currently has control over her eating with the [facility].  Lab Results  Component Value Date   HGBA1C 5.0 05/18/2023   Lab Results  Component Value Date   INSULIN 16.1 05/18/2023   INSULIN 21.4 11/16/2022    Plan Medication(s): Mounjaro 12.5 mg SQ weekly Will work on the agreed upon plan. Will minimize refined carbohydrates ( sweets and starches), and focus more on complex carbohydrates.  Increase the micronutrients found in leafy greens, which include magnesium, polyphenols, and vitamin C which have been postulated to help with insulin sensitivity. Minimize "fast food" and cook more meals at home.  She will continue to watch her portion and will keep her protein and water high.  Increase fiber to 25 to 30 grams daily.     Morbid Obesity: Current BMI BMI (Calculated): 37.11   Pharmacotherapy Plan Continue and refill  Mounjaro 12.5 mg SQ weekly  Yailine is currently in the action stage of change. As such, her goal is to continue with weight loss efforts.  She has agreed to the Category 2 plan.  Exercise goals: Older adults should follow the adult guidelines. When older adults cannot meet the adult guidelines, they should be as physically active as their abilities and conditions will allow.   Behavioral modification strategies: increasing lean protein intake, decreasing simple carbohydrates , no meal skipping, meal planning , increase water intake, planning for success, increasing fiber rich foods, avoiding temptations, weigh protein portions, and mindful eating.  Hortense has agreed to follow-up with our clinic in 4 weeks.       Objective:   VITALS: Per patient if applicable, see vitals. GENERAL: Alert and in no acute distress. CARDIOPULMONARY: No increased WOB. Speaking in clear sentences.  PSYCH: Pleasant and cooperative. Speech normal rate and rhythm. Affect is appropriate. Insight and judgement are appropriate. Attention is  focused, linear, and appropriate.  NEURO: Oriented as arrived to appointment on time with no prompting.   Attestation Statements:    This was prepared with the assistance of Engineer, civil (consulting).  Occasional wrong-word or sound-a-like substitutions may have occurred due to the inherent limitations of voice recognition   Corinna Capra, DO

## 2023-06-16 ENCOUNTER — Other Ambulatory Visit (HOSPITAL_BASED_OUTPATIENT_CLINIC_OR_DEPARTMENT_OTHER): Payer: Self-pay

## 2023-06-19 NOTE — Telephone Encounter (Signed)
Notified patient that her insurance denied her Allison Gentry and can discuss other options at her next follow-up visit. Patient verbalized understanding.

## 2023-06-19 NOTE — Telephone Encounter (Signed)
Patient is wanting to speak with you. She did not state the reason. Please give her a call when you can.

## 2023-06-30 ENCOUNTER — Other Ambulatory Visit (HOSPITAL_BASED_OUTPATIENT_CLINIC_OR_DEPARTMENT_OTHER): Payer: Self-pay

## 2023-06-30 DIAGNOSIS — S8261XD Displaced fracture of lateral malleolus of right fibula, subsequent encounter for closed fracture with routine healing: Secondary | ICD-10-CM | POA: Diagnosis not present

## 2023-06-30 MED ORDER — IBUPROFEN 800 MG PO TABS
800.0000 mg | ORAL_TABLET | Freq: Two times a day (BID) | ORAL | 1 refills | Status: DC | PRN
  Filled 2023-06-30: qty 50, 25d supply, fill #0
  Filled 2023-08-09: qty 50, 25d supply, fill #1

## 2023-07-13 ENCOUNTER — Ambulatory Visit: Payer: 59 | Admitting: Bariatrics

## 2023-07-17 ENCOUNTER — Encounter: Payer: Self-pay | Admitting: Bariatrics

## 2023-07-17 ENCOUNTER — Other Ambulatory Visit (HOSPITAL_BASED_OUTPATIENT_CLINIC_OR_DEPARTMENT_OTHER): Payer: Self-pay

## 2023-07-17 ENCOUNTER — Ambulatory Visit (INDEPENDENT_AMBULATORY_CARE_PROVIDER_SITE_OTHER): Payer: 59 | Admitting: Bariatrics

## 2023-07-17 VITALS — BP 141/83 | HR 88 | Temp 98.3°F | Ht 65.0 in | Wt 224.0 lb

## 2023-07-17 DIAGNOSIS — Z6837 Body mass index (BMI) 37.0-37.9, adult: Secondary | ICD-10-CM

## 2023-07-17 DIAGNOSIS — R632 Polyphagia: Secondary | ICD-10-CM | POA: Diagnosis not present

## 2023-07-17 DIAGNOSIS — E88819 Insulin resistance, unspecified: Secondary | ICD-10-CM

## 2023-07-17 DIAGNOSIS — E669 Obesity, unspecified: Secondary | ICD-10-CM | POA: Diagnosis not present

## 2023-07-17 MED ORDER — TRULICITY 0.75 MG/0.5ML ~~LOC~~ SOAJ
0.7500 mg | SUBCUTANEOUS | 0 refills | Status: DC
  Filled 2023-07-17: qty 2, 28d supply, fill #0

## 2023-07-17 NOTE — Progress Notes (Signed)
 WEIGHT SUMMARY AND BIOMETRICS  Weight Lost Since Last Visit: 0lb  Weight Gained Since Last Visit: 1lb   Vitals Temp: 98.3 F (36.8 C) BP: (!) 141/83 Pulse Rate: 88 SpO2: 99 %   Anthropometric Measurements Height: 5\' 5"  (1.651 m) Weight: 224 lb (101.6 kg) BMI (Calculated): 37.28 Weight at Last Visit: 223lb Weight Lost Since Last Visit: 0lb Weight Gained Since Last Visit: 1lb Starting Weight: 243lb Total Weight Loss (lbs): 19 lb (8.618 kg)   Body Composition  Body Fat %: 48.7 % Fat Mass (lbs): 109 lbs Muscle Mass (lbs): 109.2 lbs Total Body Water (lbs): 76.8 lbs Visceral Fat Rating : 16   Other Clinical Data Fasting: Yes Labs: No Today's Visit #: 10 Starting Date: 11/16/22    OBESITY Allison Gentry is here to discuss her progress with her obesity treatment plan along with follow-up of her obesity related diagnoses.    Nutrition Plan: the Category 2 plan - 75% adherence.  Current exercise: none  Interim History:  She is up 1 lb since her last visit.  Eating all of the food on the plan., Protein intake is as prescribed, Is not skipping meals, and Reports polyphagia   Pharmacotherapy: Allison Gentry had been on Mounjaro, but her insurance would not pay for the medication Adverse side effects: none with the Allison Gentry Hunger is moderately controlled.  Cravings are moderately controlled.  Assessment/Plan:   Polyphagia Allison Gentry endorses excessive hunger.  Medication(s): Mounjaro, but insurance will no longer pay. Patient was doing well on the Allison Gentry and losing weight.  Effects of medication:  moderately controlled. Cravings are moderately controlled.   Plan: Medication(s): Allison Gentry Will increase water, protein and fiber to help assuage hunger.  Will minimize foods that have a high glucose index/load to minimize reactive hypoglycemia.    Insulin Resistance Allison Gentry has had elevated fasting insulin readings. Goal is HgbA1c < 5.7, fasting insulin at l0 or less, and preferably at 5.  She reports  polyphagia. Medication(s): Had been on the Allison Gentry  Lab Results  Component Value Date   HGBA1C 5.0 05/18/2023   Lab Results  Component Value Date   INSULIN 16.1 05/18/2023   INSULIN 21.4 11/16/2022    Plan Medication(s): Allison Gentry Will work on the agreed upon plan. Will keep her home free of unhealthy snacks.  Will minimize refined carbohydrates ( sweets and starches), and focus more on complex carbohydrates.  Increase the micronutrients found in leafy greens, which include magnesium, polyphenols, and vitamin C which have been postulated to help with insulin sensitivity. Minimize "fast food" and cook more meals at home.  Increase fiber to 25 to 30 grams daily.    Generalized Obesity: Current BMI BMI (Calculated): 37.28   Pharmacotherapy Plan Start  Allison Gentry  Allison Gentry is currently in the action stage of change. As such, her goal is  to continue with weight loss efforts.  She has agreed to the Category 2 plan.  Exercise goals: Older adults should follow the adult guidelines. When older adults cannot meet the adult guidelines, they should be as physically active as their abilities and conditions will allow.   Behavioral modification strategies: increasing lean protein intake, no meal skipping, decrease eating out, meal planning , increase water intake, better snacking choices, and planning for success.  Allison Gentry has agreed to follow-up with our clinic in 4 weeks.     Objective:   VITALS: Per patient if applicable, see vitals. GENERAL: Alert and in no acute distress. CARDIOPULMONARY: No increased WOB. Speaking in clear sentences.  PSYCH: Pleasant and cooperative. Speech normal rate and rhythm. Affect is appropriate. Insight and judgement are appropriate. Attention is focused, linear, and  appropriate.  NEURO: Oriented as arrived to appointment on time with no prompting.   Attestation Statements:    This was prepared with the assistance of Engineer, civil (consulting).  Occasional wrong-word or sound-a-like substitutions may have occurred due to the inherent limitations of voice recognition   Allison Capra, DO

## 2023-07-18 ENCOUNTER — Other Ambulatory Visit (HOSPITAL_BASED_OUTPATIENT_CLINIC_OR_DEPARTMENT_OTHER): Payer: Self-pay

## 2023-07-18 ENCOUNTER — Telehealth: Payer: Self-pay

## 2023-07-18 NOTE — Telephone Encounter (Signed)
 PA submitted through Cover My Meds for Trulicity. Awaiting insurance determination. Key: York Grice

## 2023-07-18 NOTE — Telephone Encounter (Signed)
 Per Cover My Meds:  Denied today by [facility] Medicare 2017 NCPDP Request Reference Number: [id number]. TRULICITY INJ 0.75/0.5 is denied due to Plan Exclusion. For further questions, call [phone].

## 2023-08-14 ENCOUNTER — Telehealth: Payer: Self-pay | Admitting: *Deleted

## 2023-08-14 ENCOUNTER — Encounter: Payer: Self-pay | Admitting: Bariatrics

## 2023-08-14 ENCOUNTER — Other Ambulatory Visit (HOSPITAL_BASED_OUTPATIENT_CLINIC_OR_DEPARTMENT_OTHER): Payer: Self-pay

## 2023-08-14 ENCOUNTER — Ambulatory Visit (INDEPENDENT_AMBULATORY_CARE_PROVIDER_SITE_OTHER): Payer: 59 | Admitting: Bariatrics

## 2023-08-14 VITALS — BP 148/86 | HR 77 | Temp 97.7°F | Ht 65.0 in | Wt 221.0 lb

## 2023-08-14 DIAGNOSIS — E66812 Obesity, class 2: Secondary | ICD-10-CM

## 2023-08-14 DIAGNOSIS — R632 Polyphagia: Secondary | ICD-10-CM

## 2023-08-14 DIAGNOSIS — Z6836 Body mass index (BMI) 36.0-36.9, adult: Secondary | ICD-10-CM | POA: Diagnosis not present

## 2023-08-14 DIAGNOSIS — E669 Obesity, unspecified: Secondary | ICD-10-CM

## 2023-08-14 DIAGNOSIS — E88819 Insulin resistance, unspecified: Secondary | ICD-10-CM | POA: Diagnosis not present

## 2023-08-14 MED ORDER — TIRZEPATIDE 2.5 MG/0.5ML ~~LOC~~ SOAJ
2.5000 mg | SUBCUTANEOUS | 0 refills | Status: AC
  Filled 2023-08-14: qty 2, 28d supply, fill #0

## 2023-08-14 NOTE — Progress Notes (Signed)
 WEIGHT SUMMARY AND BIOMETRICS  Weight Lost Since Last Visit: 3lb  Weight Gained Since Last Visit: 0   Vitals Temp: 97.7 F (36.5 C) BP: (!) 148/86 Pulse Rate: 77 SpO2: 100 %   Anthropometric Measurements Height: 5\' 5"  (1.651 m) Weight: 221 lb (100.2 kg) BMI (Calculated): 36.78 Weight at Last Visit: 224lb Weight Lost Since Last Visit: 3lb Weight Gained Since Last Visit: 0 Starting Weight: 243lb Total Weight Loss (lbs): 22 lb (9.979 kg)   Body Composition  Body Fat %: 48.4 % Fat Mass (lbs): 107.2 lbs Muscle Mass (lbs): 108.6 lbs Total Body Water (lbs): 76.8 lbs Visceral Fat Rating : 16   Other Clinical Data Fasting: no Labs: no Today's Visit #: 11 Starting Date: 11/16/22    OBESITY Allison Gentry is here to discuss her progress with her obesity treatment plan along with follow-up of her obesity related diagnoses.    Nutrition Plan: the Category 2 plan - 50% adherence.  Current exercise: walking  Interim History:  She is down 3 lbs since her last visit.  Eating all of the food on the plan., Protein intake is as prescribed, Is not skipping meals, Water intake is adequate., and Reports polyphagia   Pharmacotherapy: Allison Gentry had been on Mounjaro, but insurance declined, will begin Zepbound at 2.5 mg ( free card given ).  Adverse side effects: None Hunger is moderately controlled.  Cravings are moderately controlled.  Assessment/Plan:   Polyphagia Allison Gentry endorses excessive hunger.  Medication(s): Had been on Mounjaro Effects of medication:  moderately controlled. Cravings are moderately controlled.   Plan: Medication(s): Mounjaro 2.5 mg SQ weekly Will increase water, protein and fiber to help assuage hunger.  Will minimize foods that have a high glucose index/load to minimize reactive hypoglycemia.   Insulin Resistance Allison Gentry has had elevated fasting  insulin readings. Goal is HgbA1c < 5.7, fasting insulin at l0 or less, and preferably at 5.  She reports polyphagia. Medication(s): She had been on [facility] and doing well.  Lab Results  Component Value Date   HGBA1C 5.0 05/18/2023   Lab Results  Component Value Date   INSULIN 16.1 05/18/2023   INSULIN 21.4 11/16/2022    Plan Medication(s): Mounjaro 2.5 mg SQ weekly Will work on the agreed upon plan. Will minimize refined carbohydrates ( sweets and starches), and focus more on complex carbohydrates.  Increase the micronutrients found in leafy greens, which include magnesium, polyphenols, and vitamin C which have been postulated to help with insulin sensitivity. Minimize "fast food" and cook more meals at home.  Increase fiber to 25 to 30 grams daily.  She will only eat until her body tells her that she is about 80 to 90% full. She will always eat her protein first. She will have her protein shake if she does not eat.   Generalized Obesity: Current BMI BMI (Calculated): 36.78   Pharmacotherapy Plan Change  to  Zepbound 2.5 mg SQ weekly  Allison Gentry is currently in the action stage of change. As such, her goal is to continue with weight loss efforts.  She has agreed to the Category 2 plan.  Exercise goals: Older adults should determine their level of effort for physical activity relative to their level of fitness.   Behavioral modification strategies: increasing lean protein intake, decreasing simple carbohydrates , no meal skipping, decrease eating out, increase water intake, better snacking choices, planning for success, increasing vegetables, decrease snacking , keep healthy foods in the home, and weigh protein portions.  Allison Gentry has agreed to follow-up with our clinic in 4 weeks.       Objective:   VITALS: Per patient if applicable, see vitals. GENERAL: Alert and in no acute distress. CARDIOPULMONARY: No increased WOB. Speaking in clear sentences.  PSYCH: Pleasant and  cooperative. Speech normal rate and rhythm. Affect is appropriate. Insight and judgement are appropriate. Attention is focused, linear, and appropriate.  NEURO: Oriented as arrived to appointment on time with no prompting.   Attestation Statements:    This was prepared with the assistance of Engineer, civil (consulting).  Occasional wrong-word or sound-a-like substitutions may have occurred due to the inherent limitations of voice recognition   Corinna Capra, DO

## 2023-08-14 NOTE — Telephone Encounter (Signed)
 Prior authorization done for [facility] via cover my meds. It was denied.  Information regarding your request We are unable to process your request for prior authorization for [facility] INJ 2.5/0.5 for the above member due to [employer] has a denied request on file for [facility] INJ 2.5/0.5 for this member. Please refer to the appeals process outlined in the original denial or contact Prior Authorization Department at [phone] for further questions.

## 2023-08-15 ENCOUNTER — Other Ambulatory Visit (HOSPITAL_BASED_OUTPATIENT_CLINIC_OR_DEPARTMENT_OTHER): Payer: Self-pay

## 2023-08-16 ENCOUNTER — Other Ambulatory Visit (HOSPITAL_BASED_OUTPATIENT_CLINIC_OR_DEPARTMENT_OTHER): Payer: Self-pay

## 2023-08-17 ENCOUNTER — Other Ambulatory Visit (HOSPITAL_BASED_OUTPATIENT_CLINIC_OR_DEPARTMENT_OTHER): Payer: Self-pay

## 2023-08-18 ENCOUNTER — Other Ambulatory Visit (HOSPITAL_BASED_OUTPATIENT_CLINIC_OR_DEPARTMENT_OTHER): Payer: Self-pay

## 2023-08-21 ENCOUNTER — Telehealth: Payer: Self-pay | Admitting: Bariatrics

## 2023-08-21 ENCOUNTER — Other Ambulatory Visit (HOSPITAL_BASED_OUTPATIENT_CLINIC_OR_DEPARTMENT_OTHER): Payer: Self-pay

## 2023-08-21 NOTE — Telephone Encounter (Signed)
 Spoke to patient and she stated that she has a coupon for her medication and had questions about her denial. Notified patient that her coupon should not have anything to do with her denial from insurance but to call back with any questions. Patient verbalized understanding.

## 2023-08-21 NOTE — Telephone Encounter (Signed)
 Pt is calling wanting to speak with a nurse about her medication.

## 2023-08-21 NOTE — Telephone Encounter (Signed)
Dr Brown pt °

## 2023-08-22 ENCOUNTER — Other Ambulatory Visit (HOSPITAL_BASED_OUTPATIENT_CLINIC_OR_DEPARTMENT_OTHER): Payer: Self-pay

## 2023-08-22 NOTE — Telephone Encounter (Signed)
 Spoke to patient and notified her I called the pharmacy and submitted coupon. Pharmacy will get medication ready. Patient verbalized understanding.

## 2023-08-22 NOTE — Telephone Encounter (Signed)
 Pt is calling wanting to speak with a nurse. Please give her a call

## 2023-09-11 ENCOUNTER — Encounter: Payer: Self-pay | Admitting: Bariatrics

## 2023-09-11 ENCOUNTER — Other Ambulatory Visit (HOSPITAL_BASED_OUTPATIENT_CLINIC_OR_DEPARTMENT_OTHER): Payer: Self-pay

## 2023-09-11 ENCOUNTER — Ambulatory Visit (INDEPENDENT_AMBULATORY_CARE_PROVIDER_SITE_OTHER): Admitting: Bariatrics

## 2023-09-11 VITALS — BP 149/83 | HR 79 | Temp 97.7°F | Ht 65.0 in | Wt 219.0 lb

## 2023-09-11 DIAGNOSIS — R632 Polyphagia: Secondary | ICD-10-CM | POA: Diagnosis not present

## 2023-09-11 DIAGNOSIS — E88819 Insulin resistance, unspecified: Secondary | ICD-10-CM

## 2023-09-11 DIAGNOSIS — Z6836 Body mass index (BMI) 36.0-36.9, adult: Secondary | ICD-10-CM

## 2023-09-11 DIAGNOSIS — E66812 Obesity, class 2: Secondary | ICD-10-CM

## 2023-09-11 MED ORDER — ONDANSETRON HCL 4 MG PO TABS
4.0000 mg | ORAL_TABLET | Freq: Three times a day (TID) | ORAL | 0 refills | Status: AC | PRN
  Filled 2023-09-11: qty 30, 10d supply, fill #0

## 2023-09-11 NOTE — Progress Notes (Signed)
 WEIGHT SUMMARY AND BIOMETRICS  Weight Lost Since Last Visit: 2lb  Weight Gained Since Last Visit: 0   Vitals Temp: 97.7 F (36.5 C) BP: (!) 151/79 Pulse Rate: 79 SpO2: 99 %   Anthropometric Measurements Height: 5\' 5"  (1.651 m) Weight: 219 lb (99.3 kg) BMI (Calculated): 36.44 Weight at Last Visit: 219lb Weight Lost Since Last Visit: 2lb Weight Gained Since Last Visit: 0 Starting Weight: 243lb Total Weight Loss (lbs): 24 lb (10.9 kg)   Body Composition  Body Fat %: 47.5 % Fat Mass (lbs): 104.4 lbs Muscle Mass (lbs): 109.4 lbs Total Body Water (lbs): 76.6 lbs Visceral Fat Rating : 16   Other Clinical Data Fasting: no Labs: no Today's Visit #: 12 Starting Date: 11/16/22    OBESITY Allison Gentry is here to discuss her progress with her obesity treatment plan along with follow-up of her obesity related diagnoses.    Nutrition Plan: the Category 2 plan - 65% adherence.  Current exercise: none  Interim History:  She is down 2 lbs since her last visit. She has been on Mounjaro  2.5 mg.  Eating all of the food on the plan., Protein intake is as prescribed, Water intake is adequate., and Denies excessive cravings.   Pharmacotherapy: Allison Gentry is on Mounjaro  2.5 mg SQ weekly Had a coupon for Mounjaro  at her last visit.  Adverse side effects: None Hunger is moderately controlled.  Cravings are moderately controlled.  Assessment/Plan:   Polyphagia Allison Gentry endorses excessive hunger.  Medication(s): Mounjaro   Effects of medication:  moderately controlled. Cravings are moderately controlled.   Plan: Medication(s): Mounjaro  2.5 mg SQ weekly Will increase water, protein and fiber to help assuage hunger.  Will minimize foods that have a high glucose index/load to minimize reactive hypoglycemia.   Insulin  Resistance Allison Gentry has had elevated fasting insulin  readings.  Goal is HgbA1c < 5.7, fasting insulin  at l0 or less, and preferably at 5.  She reports  polyphagia. Medication(s): Mounjaro  Lab Results  Component Value Date   HGBA1C 5.0 05/18/2023   Lab Results  Component Value Date   INSULIN  16.1 05/18/2023   INSULIN  21.4 11/16/2022    Plan Medication(s): Mounjaro  2.5 mg SQ weekly Will work on the agreed upon plan. Will minimize refined carbohydrates ( sweets and starches), and focus more on complex carbohydrates.  Increase the micronutrients found in leafy greens, which include magnesium, polyphenols, and vitamin C which have been postulated to help with insulin  sensitivity. Minimize "fast food" and cook more meals at home.  Increase fiber to 25 to 30 grams daily.    Morbid Obesity: Current BMI BMI (Calculated): 36.44   Pharmacotherapy Plan Continue and refill  Mounjaro  2.5 mg SQ weekly ( sample given for 1 month, Mounjaro ) Lot/Exp [id number], November 23 2024.   Allison Gentry is currently in the action stage of change. As such, her goal is to continue with weight loss efforts.  She has agreed to the Category 2 plan.  Exercise goals: Older adults should determine their level of effort for physical activity relative to their level of fitness.   Behavioral modification strategies: increasing lean protein intake, no meal skipping, decrease eating out, meal planning , increase water intake, better snacking choices, planning for success, avoiding temptations, keep healthy foods in the home, and weigh protein portions.  Allison Gentry has agreed to follow-up with our clinic in 4 weeks.    Objective:   VITALS: Per patient if applicable, see vitals. GENERAL: Alert and in no acute distress. CARDIOPULMONARY: No increased WOB. Speaking in clear sentences.  PSYCH: Pleasant and cooperative. Speech normal rate and rhythm. Affect is appropriate. Insight and judgement are appropriate. Attention is focused, linear, and appropriate.  NEURO: Oriented as arrived to appointment on  time with no prompting.   Attestation Statements:   This was prepared with the assistance of Engineer, civil (consulting).  Occasional wrong-word or sound-a-like substitutions may have occurred due to the inherent limitations of voice recognition   Kirk Peper, DO

## 2023-10-09 ENCOUNTER — Other Ambulatory Visit (HOSPITAL_BASED_OUTPATIENT_CLINIC_OR_DEPARTMENT_OTHER): Payer: Self-pay

## 2023-10-09 ENCOUNTER — Encounter: Payer: Self-pay | Admitting: Bariatrics

## 2023-10-09 ENCOUNTER — Ambulatory Visit: Admitting: Bariatrics

## 2023-10-09 VITALS — BP 136/76 | HR 77 | Temp 97.8°F | Ht 65.0 in | Wt 215.0 lb

## 2023-10-09 DIAGNOSIS — E7849 Other hyperlipidemia: Secondary | ICD-10-CM

## 2023-10-09 DIAGNOSIS — R632 Polyphagia: Secondary | ICD-10-CM

## 2023-10-09 DIAGNOSIS — E785 Hyperlipidemia, unspecified: Secondary | ICD-10-CM | POA: Diagnosis not present

## 2023-10-09 DIAGNOSIS — E669 Obesity, unspecified: Secondary | ICD-10-CM

## 2023-10-09 DIAGNOSIS — Z6835 Body mass index (BMI) 35.0-35.9, adult: Secondary | ICD-10-CM | POA: Diagnosis not present

## 2023-10-09 DIAGNOSIS — E66812 Obesity, class 2: Secondary | ICD-10-CM

## 2023-10-09 NOTE — Progress Notes (Signed)
 WEIGHT SUMMARY AND BIOMETRICS  Weight Lost Since Last Visit: 4lb  Weight Gained Since Last Visit: 0   Vitals Temp: 97.8 F (36.6 C) BP: 136/76 Pulse Rate: 77 SpO2: 97 %   Anthropometric Measurements Height: 5\' 5"  (1.651 m) Weight: 215 lb (97.5 kg) BMI (Calculated): 35.78 Weight at Last Visit: 219lb Weight Lost Since Last Visit: 4lb Weight Gained Since Last Visit: 0 Starting Weight: 243lb Total Weight Loss (lbs): 28 lb (12.7 kg)   Body Composition  Body Fat %: 46.4 % Fat Mass (lbs): 99.8 lbs Muscle Mass (lbs): 109.6 lbs Total Body Water (lbs): 74 lbs Visceral Fat Rating : 15   Other Clinical Data Fasting: no Labs: no Today's Visit #: 13 Starting Date: 11/16/22    OBESITY Allison Gentry is here to discuss her progress with her obesity treatment plan along with follow-up of her obesity related diagnoses.    Nutrition Plan: the Category 2 plan - 65% adherence.  Current exercise: walking  Interim History:  She is down another 4 lbs since her last visit.  Eating all of the food on the plan.   Pharmacotherapy: Allison Gentry is on Allison Gentry  2.5 mg SQ weekly Adverse side effects: None Hunger is moderately controlled.  Cravings are moderately controlled.  Assessment/Plan:   Polyphagia Allison Gentry endorses excessive hunger.  Medication(s): Allison Gentry  2.5 mg Effects of medication:  moderately controlled. Cravings are moderately controlled.   Plan: Medication(s): Allison Gentry  2.5 mg SQ weekly Will increase water, protein and fiber to help assuage hunger.  Will minimize foods that have a high glucose index/load to minimize reactive hypoglycemia.   Hyperlipidemia LDL has not been at goal in the past.  Medication(s): Crestor   Cardiovascular risk factors: advanced age (older than 22 for men, 33 for women), dyslipidemia, obesity (BMI >= 30 kg/m2), and sedentary lifestyle  No  results found for: "CHOL", "HDL", "LDLCALC", "LDLDIRECT", "TRIG", "CHOLHDL" Lab Results  Component Value Date   ALT 13 05/18/2023   AST 17 05/18/2023   ALKPHOS 122 (H) 05/18/2023   BILITOT 0.4 05/18/2023   The ASCVD Risk score (Arnett DK, et al., 2019) failed to calculate for the following reasons:   Cannot find a previous HDL lab   Cannot find a previous total cholesterol lab  Plan:  Continue statin.  Will avoid all trans fats.  Will read labels Will minimize saturated fats except the following: low fat meats in moderation, diary, and limited dark chocolate.  Increase Omega 3 in foods.  Will continue to cook her meals at home. Will use distractions when she is hungry. Discussed synopsis and she was given several previous synopsis for comparison sake.      Generalized Obesity: Current BMI BMI (Calculated): 35.78   Pharmacotherapy Plan Continue and refill  Allison Gentry  2.5 mg SQ weekly Lot number and expiration date Lot number was [id number], and the expiration date is February 02, 2025.  Allison Gentry is currently in the action stage of change. As such, her goal is to continue with weight loss efforts.  She has agreed to the Category 2 plan.  Exercise goals: Older adults should do exercises that maintain or improve balance if they are at risk of falling.   Behavioral modification strategies: increasing lean protein intake, no meal skipping, meal planning , increase water intake, better snacking choices, keep healthy foods in the home, measure portion sizes, and pack lunch for work.  Allison Gentry has agreed to follow-up with our clinic in 4 weeks for fasting and IC.     Objective:   VITALS: Per patient if applicable, see vitals. GENERAL: Alert and in no acute distress. CARDIOPULMONARY: No increased WOB. Speaking in clear sentences.  PSYCH: Pleasant and cooperative. Speech normal rate and rhythm. Affect is appropriate. Insight and judgement are appropriate. Attention is focused, linear, and  appropriate.  NEURO: Oriented as arrived to appointment on time with no prompting.   Attestation Statements:   This was prepared with the assistance of Engineer, civil (consulting).  Occasional wrong-word or sound-a-like substitutions may have occurred due to the inherent limitations of voice recognition   Allison Peper, DO

## 2023-10-10 ENCOUNTER — Other Ambulatory Visit (HOSPITAL_BASED_OUTPATIENT_CLINIC_OR_DEPARTMENT_OTHER): Payer: Self-pay

## 2023-10-10 MED ORDER — IBUPROFEN 800 MG PO TABS
800.0000 mg | ORAL_TABLET | Freq: Two times a day (BID) | ORAL | 1 refills | Status: DC | PRN
  Filled 2023-10-10: qty 50, 25d supply, fill #0
  Filled 2024-01-16: qty 50, 25d supply, fill #1

## 2023-11-08 ENCOUNTER — Encounter: Payer: Self-pay | Admitting: Bariatrics

## 2023-11-08 ENCOUNTER — Ambulatory Visit (INDEPENDENT_AMBULATORY_CARE_PROVIDER_SITE_OTHER): Admitting: Bariatrics

## 2023-11-08 VITALS — BP 151/85 | HR 78 | Temp 97.6°F | Ht 65.0 in | Wt 209.0 lb

## 2023-11-08 DIAGNOSIS — E559 Vitamin D deficiency, unspecified: Secondary | ICD-10-CM

## 2023-11-08 DIAGNOSIS — E7849 Other hyperlipidemia: Secondary | ICD-10-CM

## 2023-11-08 DIAGNOSIS — Z6834 Body mass index (BMI) 34.0-34.9, adult: Secondary | ICD-10-CM

## 2023-11-08 DIAGNOSIS — E88819 Insulin resistance, unspecified: Secondary | ICD-10-CM

## 2023-11-08 DIAGNOSIS — E785 Hyperlipidemia, unspecified: Secondary | ICD-10-CM | POA: Diagnosis not present

## 2023-11-08 DIAGNOSIS — R0602 Shortness of breath: Secondary | ICD-10-CM | POA: Diagnosis not present

## 2023-11-08 DIAGNOSIS — R632 Polyphagia: Secondary | ICD-10-CM | POA: Diagnosis not present

## 2023-11-08 DIAGNOSIS — R5383 Other fatigue: Secondary | ICD-10-CM

## 2023-11-08 DIAGNOSIS — E66811 Obesity, class 1: Secondary | ICD-10-CM

## 2023-11-08 NOTE — Progress Notes (Signed)
 WEIGHT SUMMARY AND BIOMETRICS  Weight Lost Since Last Visit: 6lb  Weight Gained Since Last Visit: 0   Vitals Temp: 97.6 F (36.4 C) BP: (!) 156/84 Pulse Rate: 78 SpO2: 100 %   Anthropometric Measurements Height: 5' 5 (1.651 m) Weight: 209 lb (94.8 kg) BMI (Calculated): 34.78 Weight at Last Visit: 215lb Weight Lost Since Last Visit: 6lb Weight Gained Since Last Visit: 0 Starting Weight: 243lb Total Weight Loss (lbs): 34 lb (15.4 kg)   Body Composition  Body Fat %: 45.8 % Fat Mass (lbs): 96 lbs Muscle Mass (lbs): 107.6 lbs Total Body Water (lbs): 73.6 lbs Visceral Fat Rating : 15   Other Clinical Data Fasting: yes Labs: yes Today's Visit #: 14 Starting Date: 11/16/22    OBESITY Allison Gentry is here to discuss her progress with her obesity treatment plan along with follow-up of her obesity related diagnoses.    Nutrition Plan: the Category 2 plan - 100% adherence.  Current exercise: walking  Interim History:  She is 6 lbs down his last visit.  Eating all of the food on the plan., Protein intake is as prescribed, Meeting calorie goals., and Water intake is adequate.  She is intaking more protein and trying to eat cleaner protein.  She states that she has virtually eliminated refined carbohydrates both sweets and starches.   Pharmacotherapy: Story is on Mounjaro  2.5 mg SQ weekly Adverse side effects: None Hunger is moderately controlled.  Cravings are moderately controlled.  Assessment/Plan:   Polyphagia Allison Gentry endorses excessive hunger.  Medication(s): Mounjaro  2.5 mg, Metformin  500 mg BID Effects of medication:  moderately controlled. Cravings are moderately controlled.   Plan: Medication(s): Mounjaro  2.5 mg SQ weekly Will increase water, protein and fiber to help assuage hunger.  Will minimize foods that have a high glucose index/load to  minimize reactive hypoglycemia.  Will continue to cook at home.    Fatigue and SOB:   Had an indirect calorimetry test that showed the following: Actual RMR -  1339 kcal   Expected RMR- 1579    kcal  Actual RMR is < than Expected RMR.       Read the RMR findings and discussed the ramifications of the RMR reading  with the patient and allowed to ask questions. Voiced understanding.   Will continue the current plan. Will change the plan to some plant protein along with meat.  Will make the following changes in her plan: will have a protein shake daily.  Will increase exercise to the following: Will continue walking and will add in some light weights.  Hyperlipidemia:   Taking medications as directed.   Plan: Will check lipids. Continue medications. Continue diet and exercise.    Vitamin D  deficiency:   Taking vitamin D  OTC  Plan:  Will check vitamin D .   Insulin  Resistance   She needs labs today.  She is taking  medications as directed.   Plan: Will check HgbA1c and insulin . Will continue to work on the plan and exercise as able.   Labs done today (CMP, Lipids, HgbA1c, insulin , vitamin D ).     Generalized Obesity: Current BMI BMI (Calculated): 34.78   Pharmacotherapy Plan Continue and refill  Mounjaro  2.5 mg SQ weekly (samples given of Mounjaro  2.5 mg with Lot/Exp: [id number], 02/02/2025.  Allison Gentry is currently in the action stage of change. As such, her goal is to continue with weight loss efforts.  She has agreed to the Category 2 plan.  Exercise goals: Older adults should determine their level of effort for physical activity relative to their level of fitness.   Behavioral modification strategies: increasing lean protein intake, decreasing simple carbohydrates , no meal skipping, meal planning , get rid of junk food in the home, decrease snacking , avoiding temptations, and keep healthy foods in the home.  Allison Gentry has agreed to follow-up with our clinic in 4 weeks.    Objective:   VITALS: Per patient if applicable, see vitals. GENERAL: Alert and in no acute distress. CARDIOPULMONARY: No increased WOB. Speaking in clear sentences.  PSYCH: Pleasant and cooperative. Speech normal rate and rhythm. Affect is appropriate. Insight and judgement are appropriate. Attention is focused, linear, and appropriate.  NEURO: Oriented as arrived to appointment on time with no prompting.   Attestation Statements:   This was prepared with the assistance of Engineer, civil (consulting).  Occasional wrong-word or sound-a-like substitutions may have occurred due to the inherent limitations of voice recognition   Kirk Peper, DO

## 2023-11-09 LAB — LIPID PANEL WITH LDL/HDL RATIO
Cholesterol, Total: 191 mg/dL (ref 100–199)
HDL: 49 mg/dL (ref >39)
LDL Chol Calc (NIH): 120 mg/dL — ABNORMAL HIGH (ref 0–99)
LDL/HDL Ratio: 2.4 ratio (ref 0.0–3.2)
Triglycerides: 124 mg/dL (ref 0–149)
VLDL Cholesterol Cal: 22 mg/dL (ref 5–40)

## 2023-11-09 LAB — COMPREHENSIVE METABOLIC PANEL WITH GFR
ALT: 15 IU/L (ref 0–32)
AST: 19 IU/L (ref 0–40)
Albumin: 4.6 g/dL (ref 3.8–4.8)
Alkaline Phosphatase: 142 IU/L — ABNORMAL HIGH (ref 44–121)
BUN/Creatinine Ratio: 9 — ABNORMAL LOW (ref 12–28)
BUN: 9 mg/dL (ref 8–27)
Bilirubin Total: 0.3 mg/dL (ref 0.0–1.2)
CO2: 19 mmol/L — ABNORMAL LOW (ref 20–29)
Calcium: 9.9 mg/dL (ref 8.7–10.3)
Chloride: 101 mmol/L (ref 96–106)
Creatinine, Ser: 1 mg/dL (ref 0.57–1.00)
Globulin, Total: 2.7 g/dL (ref 1.5–4.5)
Glucose: 91 mg/dL (ref 70–99)
Potassium: 4.4 mmol/L (ref 3.5–5.2)
Sodium: 139 mmol/L (ref 134–144)
Total Protein: 7.3 g/dL (ref 6.0–8.5)
eGFR: 58 mL/min/{1.73_m2} — ABNORMAL LOW (ref >59)

## 2023-11-09 LAB — INSULIN, RANDOM: INSULIN: 16 u[IU]/mL (ref 2.6–24.9)

## 2023-11-09 LAB — VITAMIN D 25 HYDROXY (VIT D DEFICIENCY, FRACTURES): Vit D, 25-Hydroxy: 93.5 ng/mL (ref 30.0–100.0)

## 2023-11-09 LAB — HEMOGLOBIN A1C
Est. average glucose Bld gHb Est-mCnc: 94 mg/dL
Hgb A1c MFr Bld: 4.9 % (ref 4.8–5.6)

## 2023-12-06 ENCOUNTER — Ambulatory Visit (INDEPENDENT_AMBULATORY_CARE_PROVIDER_SITE_OTHER): Admitting: Bariatrics

## 2023-12-06 ENCOUNTER — Encounter: Payer: Self-pay | Admitting: Bariatrics

## 2023-12-06 VITALS — BP 147/83 | HR 75 | Temp 97.9°F | Ht 65.0 in | Wt 208.0 lb

## 2023-12-06 DIAGNOSIS — Z6834 Body mass index (BMI) 34.0-34.9, adult: Secondary | ICD-10-CM

## 2023-12-06 DIAGNOSIS — R632 Polyphagia: Secondary | ICD-10-CM | POA: Diagnosis not present

## 2023-12-06 DIAGNOSIS — E785 Hyperlipidemia, unspecified: Secondary | ICD-10-CM | POA: Diagnosis not present

## 2023-12-06 DIAGNOSIS — E66811 Obesity, class 1: Secondary | ICD-10-CM

## 2023-12-06 DIAGNOSIS — E669 Obesity, unspecified: Secondary | ICD-10-CM | POA: Diagnosis not present

## 2023-12-06 DIAGNOSIS — E7849 Other hyperlipidemia: Secondary | ICD-10-CM

## 2023-12-06 NOTE — Progress Notes (Signed)
 WEIGHT SUMMARY AND BIOMETRICS  Weight Lost Since Last Visit: 1lb  Weight Gained Since Last Visit: 0   Vitals Temp: 97.9 F (36.6 C) BP: (!) 145/80 Pulse Rate: 75 SpO2: 99 %   Anthropometric Measurements Height: 5' 5 (1.651 m) Weight: 208 lb (94.3 kg) BMI (Calculated): 34.61 Weight at Last Visit: 209lb Weight Lost Since Last Visit: 1lb Weight Gained Since Last Visit: 0 Starting Weight: 243lb Total Weight Loss (lbs): 35 lb (15.9 kg)   Body Composition  Body Fat %: 46.2 % Fat Mass (lbs): 96.2 lbs Muscle Mass (lbs): 106.4 lbs Total Body Water (lbs): 75.2 lbs Visceral Fat Rating : 15   Other Clinical Data Fasting: no Labs: no Today's Visit #: 15 Starting Date: 11/16/22    OBESITY Allison Gentry is here to discuss her progress with her obesity treatment plan along with follow-up of her obesity related diagnoses.    Nutrition Plan: the Category 2 plan - 50% adherence.  Current exercise: Walking   Interim History:  She is down 1 lb since her last visit.  Eating all of the food on the plan., Protein intake is as prescribed, Is not exceeding snack calorie allotment, Not journaling consistently., and Water intake is adequate.   Pharmacotherapy: Allison Gentry is on Mounjaro  2.5 mg SQ weekly Adverse side effects: None Hunger is moderately controlled.  Cravings are moderately controlled.  Assessment/Plan:   Polyphagia Allison Gentry endorses excessive hunger.  Medication(s): Mounjaro  2.5 mg Effects of medication:  moderately controlled. Cravings are moderately controlled.   Plan: Medication(s): Mounjaro  2.5 mg SQ weekly Will increase water, protein and fiber to help assuage hunger.  Will minimize foods that have a high glucose index/load to minimize reactive hypoglycemia.   Hyperlipidemia LDL is not at goal. Medication(s): Crestor   Cardiovascular risk factors:  advanced age (older than 50 for men, 44 for women), dyslipidemia, obesity (BMI >= 30 kg/m2), and sedentary lifestyle  Lab Results  Component Value Date   CHOL 191 11/08/2023   HDL 49 11/08/2023   LDLCALC 120 (H) 11/08/2023   TRIG 124 11/08/2023   Lab Results  Component Value Date   ALT 15 11/08/2023   AST 19 11/08/2023   ALKPHOS 142 (H) 11/08/2023   BILITOT 0.3 11/08/2023   The 10-year ASCVD risk score (Arnett DK, et al., 2019) is: 18.1%   Values used to calculate the score:     Age: 78 years     Clincally relevant sex: Female     Is Non-Hispanic African American: Yes     Diabetic: No     Tobacco smoker: No     Systolic Blood Pressure: 147 mmHg     Is BP treated: Yes     HDL Cholesterol: 49 mg/dL     Total Cholesterol: 191 mg/dL  Plan:  Continue statin.  She will continue to increase her fresh fruits and vegetables.  She will  minimize high sugar fruits.  She will focus on nonstarchy vegetables. Will avoid all trans fats.  Will read labels Will minimize saturated fats except the following: low fat meats in moderation, diary, and limited dark chocolate.      Generalized Obesity: Current BMI BMI (Calculated): 34.61   Pharmacotherapy Plan Continue  Mounjaro  2.5 mg SQ weekly Sample ( Mounjaro  2.5 mg) Lot/Exp: [id number]  Allison Gentry is currently in the action stage of change. As such, her goal is to continue with weight loss efforts.  She has agreed to the Category 2 plan.  Exercise goals: Older adults should determine their level of effort for physical activity relative to their level of fitness.  She will start some hand weights and walking.   Behavioral modification strategies: increasing lean protein intake, decreasing simple carbohydrates , no meal skipping, meal planning , increase water intake, better snacking choices, planning for success, increasing vegetables, increasing fiber rich foods, avoiding temptations, and keep healthy foods in the home.  Allison Gentry has agreed to  follow-up with our clinic in 4 weeks.     Objective:   VITALS: Per patient if applicable, see vitals. GENERAL: Alert and in no acute distress. CARDIOPULMONARY: No increased WOB. Speaking in clear sentences.  PSYCH: Pleasant and cooperative. Speech normal rate and rhythm. Affect is appropriate. Insight and judgement are appropriate. Attention is focused, linear, and appropriate.  NEURO: Oriented as arrived to appointment on time with no prompting.   Attestation Statements:   This was prepared with the assistance of Engineer, civil (consulting).  Occasional wrong-word or sound-a-like substitutions may have occurred due to the inherent limitations of voice recognition   Clayborne Daring, DO

## 2024-01-04 ENCOUNTER — Ambulatory Visit (INDEPENDENT_AMBULATORY_CARE_PROVIDER_SITE_OTHER): Admitting: Bariatrics

## 2024-01-04 ENCOUNTER — Encounter: Payer: Self-pay | Admitting: Bariatrics

## 2024-01-04 VITALS — BP 146/83 | HR 83 | Ht 65.0 in | Wt 203.0 lb

## 2024-01-04 DIAGNOSIS — E669 Obesity, unspecified: Secondary | ICD-10-CM

## 2024-01-04 DIAGNOSIS — Z6833 Body mass index (BMI) 33.0-33.9, adult: Secondary | ICD-10-CM | POA: Diagnosis not present

## 2024-01-04 DIAGNOSIS — R632 Polyphagia: Secondary | ICD-10-CM

## 2024-01-04 DIAGNOSIS — E88819 Insulin resistance, unspecified: Secondary | ICD-10-CM

## 2024-01-04 DIAGNOSIS — E6609 Other obesity due to excess calories: Secondary | ICD-10-CM

## 2024-01-04 NOTE — Progress Notes (Signed)
 WEIGHT SUMMARY AND BIOMETRICS  Weight Lost Since Last Visit: 5lb  Weight Gained Since Last Visit: 0   Vitals BP: (!) 146/83 Pulse Rate: 83 SpO2: 100 %   Anthropometric Measurements Height: 5' 5 (1.651 m) Weight: 203 lb (92.1 kg) BMI (Calculated): 33.78 Weight at Last Visit: 208lb Weight Lost Since Last Visit: 5lb Weight Gained Since Last Visit: 0 Starting Weight: 243lb Total Weight Loss (lbs): 40 lb (18.1 kg)   Body Composition  Body Fat %: 45.4 % Fat Mass (lbs): 92.4 lbs Muscle Mass (lbs): 105.6 lbs Total Body Water (lbs): 72.8 lbs Visceral Fat Rating : 15   Other Clinical Data Fasting: no Labs: no Today's Visit #: 16 Starting Date: 11/16/22    OBESITY Allison Gentry is here to discuss her progress with her obesity treatment plan along with follow-up of her obesity related diagnoses.    Nutrition Plan: the Category 2 plan - 100% adherence.  Current exercise: walking  Interim History:  She is down 5 lbs since her last visit.  Eating all of the food on the plan., Protein intake is as prescribed, Journaling consistently., Meeting protein goals., and Water intake is adequate.   Pharmacotherapy: Allison Gentry is on Mounjaro  2.5 mg SQ weekly Adverse side effects: None Hunger is moderately controlled.  Cravings are moderately controlled.  Assessment/Plan:   Polyphagia Allison Gentry endorses excessive hunger.  Medication(s): Mounjaro  2.5 mg Effects of medication:  moderately controlled. Cravings are moderately controlled.   Plan: Medication(s): Mounjaro  2.5 mg SQ weekly Will increase water, protein and fiber to help assuage hunger.  Will minimize foods that have a high glucose index/load to minimize reactive hypoglycemia.  She will be consistent with her meals.   Insulin  Resistance Allison Gentry has had elevated fasting insulin  readings. Goal is HgbA1c < 5.7, fasting  insulin  at l0 or less, and preferably at 5.  She  denies polyphagia. Medication(s): Mounjaro  2.5 mg Lab Results  Component Value Date   HGBA1C 4.9 11/08/2023   Lab Results  Component Value Date   INSULIN  16.0 11/08/2023   INSULIN  16.1 05/18/2023   INSULIN  21.4 11/16/2022    Plan Medication(s): Mounjaro  2.5 mg SQ weekly Will work on the agreed upon plan. Will minimize refined carbohydrates ( sweets and starches), and focus more on complex carbohydrates.  Increase the micronutrients found in leafy greens, which include magnesium, polyphenols, and vitamin C which have been postulated to help with insulin  sensitivity. Minimize fast food and cook more meals at home.  Increase fiber to 25 to 30 grams daily.  She will have a protein shake if she does not eat.  She will cook more at home.    Generalized Obesity: Current BMI BMI (Calculated): 33.78   Pharmacotherapy Plan Continue  Mounjaro  2.5 mg SQ weekly (sample) Lot/Exp: [id number], 2026-Dec-11  Allison Gentry is currently in the action stage of change. As such, her goal is to continue  with weight loss efforts.  She has agreed to the Category 3 plan and the Category 4 plan.  Exercise goals: Older adults should determine their level of effort for physical activity relative to their level of fitness.   Behavioral modification strategies: increasing lean protein intake, decreasing simple carbohydrates , no meal skipping, meal planning , better snacking choices, planning for success, keep healthy foods in the home, weigh protein portions, measure portion sizes, and mindful eating.  [name] has agreed to follow-up with our clinic in 4 weeks.       Objective:   VITALS: Per patient if applicable, see vitals. GENERAL: Alert and in no acute distress. CARDIOPULMONARY: No increased WOB. Speaking in clear sentences.  PSYCH: Pleasant and cooperative. Speech normal rate and rhythm. Affect is appropriate. Insight and judgement are appropriate. Attention  is focused, linear, and appropriate.  NEURO: Oriented as arrived to appointment on time with no prompting.   Attestation Statements:   This was prepared with the assistance of Engineer, civil (consulting).  Occasional wrong-word or sound-a-like substitutions may have occurred due to the inherent limitations of voice recognition   Clayborne Daring, DO

## 2024-02-07 ENCOUNTER — Encounter: Payer: Self-pay | Admitting: Bariatrics

## 2024-02-07 ENCOUNTER — Ambulatory Visit (INDEPENDENT_AMBULATORY_CARE_PROVIDER_SITE_OTHER): Admitting: Bariatrics

## 2024-02-07 VITALS — BP 155/83 | HR 64 | Temp 97.7°F | Ht 65.0 in | Wt 207.0 lb

## 2024-02-07 DIAGNOSIS — E88819 Insulin resistance, unspecified: Secondary | ICD-10-CM

## 2024-02-07 DIAGNOSIS — Z6834 Body mass index (BMI) 34.0-34.9, adult: Secondary | ICD-10-CM

## 2024-02-07 DIAGNOSIS — R632 Polyphagia: Secondary | ICD-10-CM

## 2024-02-07 DIAGNOSIS — E669 Obesity, unspecified: Secondary | ICD-10-CM

## 2024-02-07 DIAGNOSIS — E66811 Obesity, class 1: Secondary | ICD-10-CM

## 2024-02-07 NOTE — Progress Notes (Signed)
 WEIGHT SUMMARY AND BIOMETRICS  Weight Lost Since Last Visit: 0  Weight Gained Since Last Visit: 4lb   Vitals Temp: 97.7 F (36.5 C) BP: (!) 155/83 Pulse Rate: 64 SpO2: 98 %   Anthropometric Measurements Height: 5' 5 (1.651 m) Weight: 207 lb (93.9 kg) BMI (Calculated): 34.45 Weight at Last Visit: 203lb Weight Lost Since Last Visit: 0 Weight Gained Since Last Visit: 4lb Starting Weight: 243lb Total Weight Loss (lbs): 36 lb (16.3 kg)   Body Composition  Body Fat %: 45.8 % Fat Mass (lbs): 94.8 lbs Muscle Mass (lbs): 106.6 lbs Total Body Water (lbs): 74.2 lbs Visceral Fat Rating : 15   Other Clinical Data Fasting: no Labs: no Today's Visit #: 17 Starting Date: 11/16/22    OBESITY Ladeana is here to discuss her progress with her obesity treatment plan along with follow-up of her obesity related diagnoses.    Nutrition Plan: the Category 2 plan - 95% adherence.  Current exercise: walking  Interim History:  She is up 4 lbs since her last visit. According to the bio-impedence scale, she is up 1 lb of muscle, and approximately 2 lbs of water. She has been traveling and has not been eating the same.  Eating all of the food on the plan., Protein intake is as prescribed, Is skipping meals, and Water intake is adequate.   Pharmacotherapy: Ebone is on Mounjaro  2.5 mg SQ weekly Adverse side effects: None Hunger is moderately controlled.  Cravings are moderately controlled.  Assessment/Plan:   Polyphagia Consepcion endorses excessive hunger.  Medication(s): Mounjaro  Effects of medication:  moderately controlled. Cravings are moderately controlled.   Plan: Medication(s): Mounjaro  2.5 mg SQ weekly Will increase water, protein and fiber to help assuage hunger.  Will minimize foods that have a high glucose index/load to minimize reactive hypoglycemia.  She  will increase her walking.   Insulin  Resistance Clarita has had elevated fasting insulin  readings. Goal is HgbA1c < 5.7, fasting insulin  at l0 or less, and preferably at 5.  She denies polyphagia. Medication(s): Mounjaro  2.5 mg  Lab Results  Component Value Date   HGBA1C 4.9 11/08/2023   Lab Results  Component Value Date   INSULIN  16.0 11/08/2023   INSULIN  16.1 05/18/2023   INSULIN  21.4 11/16/2022    Plan Medication(s): Mounjaro  2.5 mg SQ weekly Will work on the agreed upon plan. Will minimize refined carbohydrates ( sweets and starches), and focus more on complex carbohydrates.  Increase the micronutrients found in leafy greens, which include magnesium, polyphenols, and vitamin C which have been postulated to help with insulin  sensitivity. Minimize fast food and cook more meals at home.  Increase fiber to 25 to 30 grams daily.  She will eliminate salt from her diet. She will increase her water intake   Generalized Obesity: Current BMI BMI (Calculated): 34.45   Pharmacotherapy Plan Continue and refill  Wegovy 0.25  mg SQ weekly (sample for Mounjaro  2.5 mg Lot/Exp [id number] C, 07/16/2025).   Breuna is currently in the action stage of change. As such, her goal is to continue with weight loss efforts.  She has agreed to the Category 2 plan.  Exercise goals: Older adults should determine their level of effort for physical activity relative to their level of fitness.   Behavioral modification strategies: increasing lean protein intake, decreasing simple carbohydrates , no meal skipping, meal planning , decrease liquid calories, better snacking choices, planning for success, increasing lower sugar fruits, increasing fiber rich foods, and get rid of junk food in the home.  Nyonna has agreed to follow-up with our clinic in 4 weeks.   Objective:   VITALS: Per patient if applicable, see vitals. GENERAL: Alert and in no acute distress. CARDIOPULMONARY: No increased WOB. Speaking in clear  sentences.  PSYCH: Pleasant and cooperative. Speech normal rate and rhythm. Affect is appropriate. Insight and judgement are appropriate. Attention is focused, linear, and appropriate.  NEURO: Oriented as arrived to appointment on time with no prompting.   Attestation Statements:   This was prepared with the assistance of Engineer, civil (consulting).  Occasional wrong-word or sound-a-like substitutions may have occurred due to the inherent limitations of voice recognition   Clayborne Daring, DO

## 2024-03-06 ENCOUNTER — Encounter: Payer: Self-pay | Admitting: Bariatrics

## 2024-03-06 ENCOUNTER — Ambulatory Visit (INDEPENDENT_AMBULATORY_CARE_PROVIDER_SITE_OTHER): Admitting: Bariatrics

## 2024-03-06 VITALS — BP 134/83 | HR 77 | Temp 97.8°F | Ht 65.0 in | Wt 204.0 lb

## 2024-03-06 DIAGNOSIS — Z6833 Body mass index (BMI) 33.0-33.9, adult: Secondary | ICD-10-CM

## 2024-03-06 DIAGNOSIS — I10 Essential (primary) hypertension: Secondary | ICD-10-CM

## 2024-03-06 DIAGNOSIS — R632 Polyphagia: Secondary | ICD-10-CM

## 2024-03-06 DIAGNOSIS — E669 Obesity, unspecified: Secondary | ICD-10-CM

## 2024-03-06 NOTE — Progress Notes (Signed)
 WEIGHT SUMMARY AND BIOMETRICS  Weight Lost Since Last Visit: 3lb  Weight Gained Since Last Visit: 0   Vitals Temp: 97.8 F (36.6 C) BP: 134/83 Pulse Rate: 77 SpO2: 98 %   Anthropometric Measurements Height: 5' 5 (1.651 m) Weight: 204 lb (92.5 kg) BMI (Calculated): 33.95 Weight at Last Visit: 207lb Weight Lost Since Last Visit: 3lb Weight Gained Since Last Visit: 0 Starting Weight: 243lb Total Weight Loss (lbs): 39 lb (17.7 kg)   Body Composition  Body Fat %: 45.8 % Fat Mass (lbs): 93.6 lbs Muscle Mass (lbs): 105 lbs Total Body Water (lbs): 72.2 lbs Visceral Fat Rating : 15   Other Clinical Data Fasting: no Labs: no Today's Visit #: 18 Starting Date: 11/16/22    OBESITY Adriannah is here to discuss her progress with her obesity treatment plan along with follow-up of her obesity related diagnoses.    Nutrition Plan: the Category 2 plan - 80% adherence.  Current exercise: walking  Interim History:  She is down 3 lbs since her last visit. Eating all of the food on the plan., Protein intake is as prescribed, and Water intake is adequate.   Pharmacotherapy: Ivoree is on Mounjaro  2.5 mg SQ weekly Adverse side effects: None Hunger is moderately controlled.  Cravings are moderately controlled.  Assessment/Plan:   Polyphagia Agness endorses excessive hunger.  Medication(s): Mounjaro  2.5 mg Effects of medication (appetite):  moderately controlled. Cravings are moderately controlled.   Plan: Medication(s): Mounjaro  2.5 mg SQ weekly X 2 boxes of Mounjaro  for a total dose of 5 mg. @ boxes with the following: Lot and Exp #  1. [id number], 06/21/2025, 2. [id number], 06/21/2025.  Will increase water, protein and fiber to help assuage hunger.  Will minimize foods that have a high glucose index/load to minimize reactive hypoglycemia.   Hypertension Hypertension  improved.  Medication(s): Amlodipine  5 mg 1 daily   BP Readings from Last 3 Encounters:  03/06/24 134/83  02/07/24 (!) 155/83  01/04/24 (!) 146/83   Lab Results  Component Value Date   CREATININE 1.00 11/08/2023   CREATININE 1.00 05/18/2023   CREATININE 0.9 09/13/2022   No results found for: GFR  Plan: Continue all antihypertensives at current dosages. No added salt. Will keep sodium content to 1,500 mg or less per day.      Generalized Obesity: Current BMI BMI (Calculated): 33.95   Pharmacotherapy Plan Continue  Mounjaro  2.5 mg SQ weekly X 2  (total dose % mg every 10 days).   Tericka is currently in the action stage of change. As such, her goal is to continue with weight loss efforts.  She has agreed to the Category 2 plan.  Exercise goals: Older adults should determine their level of effort for physical activity relative to their level of fitness.   Behavioral modification strategies: increasing lean protein intake, decreasing simple carbohydrates , no meal skipping,  increase water intake, better snacking choices, planning for success, weigh protein portions, measure portion sizes, work on smaller portions, and mindful eating.  Kariel has agreed to follow-up with our clinic in 4 weeks.    Objective:   VITALS: Per patient if applicable, see vitals. GENERAL: Alert and in no acute distress. CARDIOPULMONARY: No increased WOB. Speaking in clear sentences.  PSYCH: Pleasant and cooperative. Speech normal rate and rhythm. Affect is appropriate. Insight and judgement are appropriate. Attention is focused, linear, and appropriate.  NEURO: Oriented as arrived to appointment on time with no prompting.   Attestation Statements:   This was prepared with the assistance of Engineer, civil (consulting).  Occasional wrong-word or sound-a-like substitutions may have occurred due to the inherent limitations of voice recognition   Clayborne Daring, DO

## 2024-04-03 ENCOUNTER — Ambulatory Visit (INDEPENDENT_AMBULATORY_CARE_PROVIDER_SITE_OTHER): Admitting: Bariatrics

## 2024-04-03 ENCOUNTER — Encounter: Payer: Self-pay | Admitting: Bariatrics

## 2024-04-03 VITALS — BP 134/84 | HR 76 | Ht 65.0 in | Wt 202.0 lb

## 2024-04-03 DIAGNOSIS — Z6833 Body mass index (BMI) 33.0-33.9, adult: Secondary | ICD-10-CM

## 2024-04-03 DIAGNOSIS — R632 Polyphagia: Secondary | ICD-10-CM | POA: Diagnosis not present

## 2024-04-03 DIAGNOSIS — I10 Essential (primary) hypertension: Secondary | ICD-10-CM

## 2024-04-03 DIAGNOSIS — E669 Obesity, unspecified: Secondary | ICD-10-CM | POA: Diagnosis not present

## 2024-04-03 DIAGNOSIS — E6609 Other obesity due to excess calories: Secondary | ICD-10-CM

## 2024-04-03 NOTE — Progress Notes (Signed)
 WEIGHT SUMMARY AND BIOMETRICS  Weight Lost Since Last Visit: 2lb  Weight Gained Since Last Visit: 0   Vitals BP: 134/84 Pulse Rate: 76 SpO2: 100 %   Anthropometric Measurements Height: 5' 5 (1.651 m) Weight: 202 lb (91.6 kg) BMI (Calculated): 33.61 Weight at Last Visit: 204lb Weight Lost Since Last Visit: 2lb Weight Gained Since Last Visit: 0 Starting Weight: 243lb Total Weight Loss (lbs): 41 lb (18.6 kg)   Body Composition  Body Fat %: 44.4 % Fat Mass (lbs): 106.8 lbs Muscle Mass (lbs): 106.8 lbs Total Body Water (lbs): 71.4 lbs Visceral Fat Rating : 14   Other Clinical Data Fasting: no Labs: no Today's Visit #: 19 Starting Date: 11/16/22    OBESITY Annamarie is here to discuss her progress with her obesity treatment plan along with follow-up of her obesity related diagnoses.    Nutrition Plan: the Category 2 plan - 100% adherence.  Current exercise: none  Interim History:  She is down 2 lbs since her last visit, but has done well overall.  Eating all of the food on the plan., Protein intake is as prescribed, and Water intake is adequate.   Pharmacotherapy: Jasline is on Mounjaro  5.0 mg SQ weekly Adverse side effects: Constipation, but tolerated with increased fruits and vegetables.  Hunger is moderately controlled.  Cravings are moderately controlled.  Assessment/Plan:    Polyphagia Lillee endorses excessive hunger.  Medication(s): Mounjaro   Effects of medication:  moderately controlled. Cravings are moderately controlled.   Plan: Medication(s): Mounjaro  5.0 mg SQ weekly Mounjaro  2.5 mg x 2  boxes of 4 ( dose 1 injection every 5 days). LotExt: ([id number] C ) 11/16/25, and ([id number] C) and 07/19/25.  Will increase water, protein and fiber to help assuage hunger.  Will minimize foods that have a high glucose index/load to minimize reactive  hypoglycemia.  Will continue to plan meals.   Hypertension Hypertension stable.  Medication(s): Amlodipine  5 mg 1 daily   BP Readings from Last 3 Encounters:  04/03/24 134/84  03/06/24 134/83  02/07/24 (!) 155/83   Lab Results  Component Value Date   CREATININE 1.00 11/08/2023   CREATININE 1.00 05/18/2023   CREATININE 0.9 09/13/2022   No results found for: GFR  Plan: Continue all antihypertensives at current dosages. No added salt. Will keep sodium content to 1,500 mg or less per day.     Generalized Obesity: Current BMI BMI (Calculated): 33.61   Pharmacotherapy Plan Continue and refill  Mounjaro  5.0 mg SQ weekly  Earnstine is currently in the action stage of change. As such, her goal is to continue with weight loss efforts.  She has agreed to the Category 2 plan.  Exercise goals: Older adults should do exercises that maintain or improve balance if they are at risk of falling.   Behavioral modification strategies: increasing lean protein intake, no meal skipping, meal planning , increase water  intake, better snacking choices, planning for success, increasing vegetables, decrease snacking , ways to avoid night time snacking, avoiding temptations, and keep healthy foods in the home.  Ceilidh has agreed to follow-up with our clinic in 4 weeks.      Objective:   VITALS: Per patient if applicable, see vitals. GENERAL: Alert and in no acute distress. CARDIOPULMONARY: No increased WOB. Speaking in clear sentences.  PSYCH: Pleasant and cooperative. Speech normal rate and rhythm. Affect is appropriate. Insight and judgement are appropriate. Attention is focused, linear, and appropriate.  NEURO: Oriented as arrived to appointment on time with no prompting.   Attestation Statements:   This was prepared with the assistance of Engineer, Civil (consulting).  Occasional wrong-word or sound-a-like substitutions may have occurred due to the inherent limitations of voice recognition   Clayborne Daring, DO

## 2024-05-01 ENCOUNTER — Ambulatory Visit (INDEPENDENT_AMBULATORY_CARE_PROVIDER_SITE_OTHER): Admitting: Bariatrics

## 2024-05-01 ENCOUNTER — Encounter: Payer: Self-pay | Admitting: Bariatrics

## 2024-05-01 VITALS — BP 138/82 | HR 76 | Ht 65.0 in | Wt 201.0 lb

## 2024-05-01 DIAGNOSIS — Z6833 Body mass index (BMI) 33.0-33.9, adult: Secondary | ICD-10-CM

## 2024-05-01 DIAGNOSIS — E6609 Other obesity due to excess calories: Secondary | ICD-10-CM

## 2024-05-01 DIAGNOSIS — E88819 Insulin resistance, unspecified: Secondary | ICD-10-CM | POA: Diagnosis not present

## 2024-05-01 DIAGNOSIS — R632 Polyphagia: Secondary | ICD-10-CM

## 2024-05-01 DIAGNOSIS — E669 Obesity, unspecified: Secondary | ICD-10-CM

## 2024-05-01 NOTE — Progress Notes (Signed)
 WEIGHT SUMMARY AND BIOMETRICS  Weight Lost Since Last Visit: 1lb  Weight Gained Since Last Visit: 0   Vitals BP: 138/82 Pulse Rate: 76 SpO2: 100 %   Anthropometric Measurements Height: 5' 5 (1.651 m) Weight: 201 lb (91.2 kg) BMI (Calculated): 33.45 Weight at Last Visit: 202lb Weight Lost Since Last Visit: 1lb Weight Gained Since Last Visit: 0 Starting Weight: 243lb Total Weight Loss (lbs): 42 lb (19.1 kg)   Body Composition  Body Fat %: 49.7 % Fat Mass (lbs): 99.8 lbs Muscle Mass (lbs): 96 lbs Total Body Water (lbs): 70 lbs Visceral Fat Rating : 16   Other Clinical Data Fasting: no Labs: no Today's Visit #: 20 Starting Date: 11/16/22    OBESITY Allison Gentry is here to discuss her progress with her obesity treatment plan along with follow-up of her obesity related diagnoses.    Nutrition Plan: the Category 2 plan - 100% adherence.  Current exercise: none  Interim History:  Her weight is down 1 lb since her last visit.  She states that she has gone to more family events and more celebrations.  She is taking her Mounjaro  as directed but states that her appetite has been slightly higher. Eating all of the food on the plan., Protein intake is as prescribed, Not journaling consistently., and Water intake is adequate.   Pharmacotherapy: Allison Gentry is on Mounjaro  2.5 mg SQ weekly (taking differently) Adverse side effects: None Hunger is moderately controlled.  Cravings are moderately controlled.  Assessment/Plan:   Polyphagia Allison Gentry endorses excessive hunger.  Medication(s): Mounjaro  2.5 mg 1 every 4 days ( this is an increase)  Effects of medication:  moderately controlled. Cravings are moderately controlled.   Plan: Medication(s): Mounjaro  2.5 mg SQ weekly (taking 1 every 4 days) 2 samples of Mounjaro  2.5 mg 1 with the lot and expiration date as follows:  Lot: [id number] C with expiration date of 11/16/2025 X 2.  Will increase water, protein and fiber to help assuage hunger.  Will minimize foods that have a high glucose index/load to minimize reactive hypoglycemia.  Will minimize eating out. Will do more stretching especially for her ankle that was broken in the recent past, and continue her fish oil.  Insulin  Resistance Allison Gentry has had elevated fasting insulin  readings. Goal is HgbA1c < 5.7, fasting insulin  at l0 or less, and preferably at 5.  She reports polyphagia intermittently.  Medication(s): Mounjaro   Lab Results  Component Value Date   HGBA1C 4.9 11/08/2023   Lab Results  Component Value Date   INSULIN  16.0 11/08/2023   INSULIN  16.1 05/18/2023   INSULIN  21.4 11/16/2022    Plan Medication(s): Mounjaro  2.5 mg SQ weekly Will work on the agreed upon plan. Minimize fast food and cook more meals at home.    Generalized Obesity: Current BMI BMI (Calculated): 33.45   Pharmacotherapy Plan Continue  Mounjaro  2.5 mg SQ weekly (as  above)  Allison Gentry is currently in the action stage of change. As such, her goal is to continue with weight loss efforts.  She has agreed to the Category 2 plan.  Exercise goals: Older adults should do exercises that maintain or improve balance if they are at risk of falling.   Behavioral modification strategies: increasing lean protein intake, no meal skipping, meal planning , increase water intake, better snacking choices, planning for success, increasing vegetables, keep healthy foods in the home, weigh protein portions, measure portion sizes, work on smaller portions, and mindful eating.  Allison Gentry has agreed to follow-up with our clinic in 4 weeks.     Objective:   VITALS: Per patient if applicable, see vitals. GENERAL: Alert and in no acute distress. CARDIOPULMONARY: No increased WOB. Speaking in clear sentences.  PSYCH: Pleasant and cooperative. Speech normal rate and rhythm. Affect is appropriate.  Insight and judgement are appropriate. Attention is focused, linear, and appropriate.  NEURO: Oriented as arrived to appointment on time with no prompting.   Attestation Statements:   This was prepared with the assistance of Engineer, Civil (consulting).  Occasional wrong-word or sound-a-like substitutions may have occurred due to the inherent limitations of voice recognition   Clayborne Daring, DO

## 2024-05-09 ENCOUNTER — Other Ambulatory Visit (HOSPITAL_BASED_OUTPATIENT_CLINIC_OR_DEPARTMENT_OTHER): Payer: Self-pay

## 2024-05-09 MED ORDER — IBUPROFEN 800 MG PO TABS
800.0000 mg | ORAL_TABLET | Freq: Two times a day (BID) | ORAL | 1 refills | Status: AC | PRN
  Filled 2024-05-09: qty 50, 25d supply, fill #0

## 2024-05-29 ENCOUNTER — Ambulatory Visit: Admitting: Bariatrics

## 2024-05-29 ENCOUNTER — Encounter: Payer: Self-pay | Admitting: Bariatrics

## 2024-05-29 VITALS — BP 143/85 | HR 71 | Temp 97.7°F | Ht 65.0 in | Wt 199.0 lb

## 2024-05-29 DIAGNOSIS — E66811 Obesity, class 1: Secondary | ICD-10-CM

## 2024-05-29 DIAGNOSIS — Z6833 Body mass index (BMI) 33.0-33.9, adult: Secondary | ICD-10-CM | POA: Diagnosis not present

## 2024-05-29 DIAGNOSIS — R632 Polyphagia: Secondary | ICD-10-CM | POA: Diagnosis not present

## 2024-05-29 DIAGNOSIS — E669 Obesity, unspecified: Secondary | ICD-10-CM | POA: Diagnosis not present

## 2024-05-29 DIAGNOSIS — I10 Essential (primary) hypertension: Secondary | ICD-10-CM

## 2024-05-29 NOTE — Progress Notes (Signed)
 "                                                                                                             WEIGHT SUMMARY AND BIOMETRICS  Weight Lost Since Last Visit: 2 lb  Weight Gained Since Last Visit: 0   Vitals Temp: 97.7 F (36.5 C) BP: (!) 143/85 Pulse Rate: 71 SpO2: 100 %   Anthropometric Measurements Height: 5' 5 (1.651 m) Weight: 199 lb (90.3 kg) BMI (Calculated): 33.12 Weight at Last Visit: 201 lb Weight Lost Since Last Visit: 2 lb Weight Gained Since Last Visit: 0 Starting Weight: 243 lb Total Weight Loss (lbs): 44 lb (20 kg)   Body Composition  Body Fat %: 45.6 % Fat Mass (lbs): 91.2 lbs Muscle Mass (lbs): 103 lbs Total Body Water (lbs): 72 lbs Visceral Fat Rating : 14   Other Clinical Data Fasting: yes Labs: no Today's Visit #: 21 Starting Date: 11/16/22    OBESITY Allison Gentry is here to discuss her progress with her obesity treatment plan along with follow-up of her obesity related diagnoses.    Nutrition Plan: the Category 2 plan - 100% adherence.  Current exercise: none  Interim History:  She is down another 2 lbs since her last visit.  Eating all of the food on the plan., Protein intake is as prescribed, Is not skipping meals, and Water intake is adequate.   Pharmacotherapy: Allison Gentry is on Mounjaro  2.5 mg SQ weekly  Adverse side effects: None Hunger is well controlled.  Cravings are moderately controlled.  Assessment/Plan:   Polyphagia Allison Gentry endorses excessive hunger.  Medication(s): Mounjaro  2.5 mg Effects of medication:  well controlled. Cravings are moderately controlled.   Plan: Medication(s): Mounjaro  2.5 mg SQ weekly Will increase water, protein and fiber to help assuage hunger.  Will minimize foods that have a high glucose index/load to minimize reactive hypoglycemia.   Hypertension Hypertension borderline controlled.  Medication(s): Amlodipine  5 mg 1 daily   BP Readings from Last 3 Encounters:  05/29/24 (!) 143/85   05/01/24 138/82  04/03/24 134/84   Lab Results  Component Value Date   CREATININE 1.00 11/08/2023   CREATININE 1.00 05/18/2023   CREATININE 0.9 09/13/2022   No results found for: GFR  Plan: Continue all antihypertensives at current dosages. No added salt. Will keep sodium content to 1,500 mg or less per day.       Generalized Obesity: Current BMI BMI (Calculated): 33.12   Pharmacotherapy Plan Continue and refill  Mounjaro  2.5 mg SQ weekly (samples of Mounjaro  2.5 mg Lot/Exp: [id number] C, November 05, 2025 X 2   Allison Gentry is currently in the action stage of change. As such, her goal is to continue with weight loss efforts.  She has agreed to the Category 2 plan.  Exercise goals: Older adults should determine their level of effort for physical activity relative to their level of fitness.   Behavioral modification strategies: increasing lean protein intake, decreasing simple carbohydrates , no meal skipping, meal planning , better snacking choices, planning for success, increasing vegetables, decrease  snacking , weigh protein portions, measure portion sizes, work on smaller portions, and mindful eating.  Allison Gentry has agreed to follow-up with our clinic in 4 weeks.     Objective:   VITALS: Per patient if applicable, see vitals. GENERAL: Alert and in no acute distress. CARDIOPULMONARY: No increased WOB. Speaking in clear sentences.  PSYCH: Pleasant and cooperative. Speech normal rate and rhythm. Affect is appropriate. Insight and judgement are appropriate. Attention is focused, linear, and appropriate.  NEURO: Oriented as arrived to appointment on time with no prompting.   Attestation Statements:   This was prepared with the assistance of Engineer, Civil (consulting).  Occasional wrong-word or sound-a-like substitutions may have occurred due to the inherent limitations of voice recognition.   Clayborne Daring, DO    "

## 2024-06-26 ENCOUNTER — Encounter: Payer: Self-pay | Admitting: Bariatrics

## 2024-06-26 ENCOUNTER — Ambulatory Visit: Admitting: Bariatrics

## 2024-06-26 VITALS — BP 147/82 | HR 76 | Ht 65.0 in | Wt 203.0 lb

## 2024-06-26 DIAGNOSIS — E669 Obesity, unspecified: Secondary | ICD-10-CM

## 2024-06-26 DIAGNOSIS — E88819 Insulin resistance, unspecified: Secondary | ICD-10-CM

## 2024-06-26 DIAGNOSIS — Z6833 Body mass index (BMI) 33.0-33.9, adult: Secondary | ICD-10-CM | POA: Diagnosis not present

## 2024-06-26 DIAGNOSIS — E6609 Other obesity due to excess calories: Secondary | ICD-10-CM

## 2024-06-26 DIAGNOSIS — R632 Polyphagia: Secondary | ICD-10-CM

## 2024-06-26 NOTE — Progress Notes (Unsigned)
 "                                                                                                             WEIGHT SUMMARY AND BIOMETRICS  Weight Lost Since Last Visit: 0  Weight Gained Since Last Visit: 3lb   Vitals BP: (!) 147/82 Pulse Rate: 76 SpO2: 99 %   Anthropometric Measurements Height: 5' 5 (1.651 m) Weight: 203 lb (92.1 kg) BMI (Calculated): 33.78 Weight at Last Visit: 199lb Weight Lost Since Last Visit: 0 Weight Gained Since Last Visit: 3lb Starting Weight: 243lb Total Weight Loss (lbs): 41 lb (18.6 kg)   Body Composition  Body Fat %: 45.8 % Fat Mass (lbs): 93.2 lbs Muscle Mass (lbs): 104.8 lbs Total Body Water (lbs): 72.6 lbs Visceral Fat Rating : 15   Other Clinical Data Fasting: no Labs: no Today's Visit #: 22 Starting Date: 11/16/22    OBESITY Allison Gentry is here to discuss her progress with her obesity treatment plan along with follow-up of her obesity related diagnoses.    Nutrition Plan: the Category 2 plan - 75% adherence.  Current exercise: none  Interim History:  She is up 3 pounds since her last visit.  She has been confined to her house more with more inclement weather.  She has had more cravings and somewhat higher appetite. Eating all of the food on the plan., Protein intake is as prescribed, Is not skipping meals, Not journaling consistently., and Water intake is adequate.   Pharmacotherapy: Allison Gentry is on Mounjaro  5.0 mg SQ weekly Adverse side effects: None Hunger is moderately controlled.  Cravings are moderately controlled.  Assessment/Plan:   Insulin  Resistance Allison Gentry has had elevated fasting insulin  readings. Goal is HgbA1c < 5.7, fasting insulin  at l0 or less, and preferably at 5.  She reports polyphagia. Medication(s): Mounjaro   Lab Results  Component Value Date   HGBA1C 4.9 11/08/2023   Lab Results  Component Value Date   INSULIN  16.0 11/08/2023   INSULIN  16.1 05/18/2023   INSULIN  21.4 11/16/2022     Plan Medication(s): Mounjaro  5.0 mg SQ weekly Will work on the agreed upon plan. Will minimize refined carbohydrates ( sweets and starches), and focus more on complex carbohydrates.  Increase the micronutrients found in leafy greens, which include magnesium, polyphenols, and vitamin C which have been postulated to help with insulin  sensitivity. Minimize fast food and cook more meals at home.  Increase fiber to 25 to 30 grams daily.  Will increase her fiber. Will increase her protein. Will increase her healthy fruits and vegetables. Will continue some exercise and will consider some online or other sources of exercise when the weather is poor. Allison Gentry endorses excessive hunger.  Medication(s): Mounjaro  Effects of medication:  moderately controlled. Cravings are moderately controlled.   Plan: Medication(s): Mounjaro  5.0 mg SQ weekly Will increase water, protein and fiber to help assuage hunger.  Will minimize foods that have a high glucose index/load to minimize reactive hypoglycemia.    Generalized Obesity: Current BMI BMI (Calculated): 33.78  Pharmacotherapy Plan Continue  Mounjaro  5.0 mg SQ weekly  Allison Gentry is currently in the action stage of change. As such, her goal is to continue with weight loss efforts.  She has agreed to the Category 2 plan.  Exercise goals: Older adults should determine their level of effort for physical activity relative to their level of fitness.   Behavioral modification strategies: increasing lean protein intake, meal planning , increase water intake, better snacking choices, planning for success, increasing lower sugar fruits, keep healthy foods in the home, weigh protein portions, measure portion sizes, pack lunch for work, and mindful eating.  Allison Gentry has agreed to follow-up with our clinic in 4 weeks.     Objective:   VITALS: Per patient if applicable, see vitals. GENERAL: Alert and in no acute distress. CARDIOPULMONARY: No  increased WOB. Speaking in clear sentences.  PSYCH: Pleasant and cooperative. Speech normal rate and rhythm. Affect is appropriate. Insight and judgement are appropriate. Attention is focused, linear, and appropriate.  NEURO: Oriented as arrived to appointment on time with no prompting.   Attestation Statements:   This was prepared with the assistance of Engineer, Civil (consulting).  Occasional wrong-word or sound-a-like substitutions may have occurred due to the inherent limitations of voice recognition .   Clayborne Daring, DO   "

## 2024-07-24 ENCOUNTER — Ambulatory Visit: Admitting: Bariatrics

## 2024-08-21 ENCOUNTER — Ambulatory Visit: Admitting: Bariatrics
